# Patient Record
Sex: Female | Born: 1985 | Race: White | Hispanic: No | Marital: Married | Smoking: Former smoker
Health system: Southern US, Community
[De-identification: ages and names within clinical notes are randomized; demographics above are authoritative.]

## PROBLEM LIST (undated history)

## (undated) DIAGNOSIS — IMO0002 Reserved for concepts with insufficient information to code with codable children: Secondary | ICD-10-CM

## (undated) DIAGNOSIS — G43709 Chronic migraine without aura, not intractable, without status migrainosus: Secondary | ICD-10-CM

## (undated) DIAGNOSIS — R413 Other amnesia: Secondary | ICD-10-CM

## (undated) DIAGNOSIS — F32A Depression, unspecified: Secondary | ICD-10-CM

## (undated) DIAGNOSIS — G35D Multiple sclerosis, unspecified: Secondary | ICD-10-CM

## (undated) DIAGNOSIS — R200 Anesthesia of skin: Secondary | ICD-10-CM

## (undated) DIAGNOSIS — F329 Major depressive disorder, single episode, unspecified: Secondary | ICD-10-CM

## (undated) DIAGNOSIS — R42 Dizziness and giddiness: Secondary | ICD-10-CM

## (undated) DIAGNOSIS — H538 Other visual disturbances: Secondary | ICD-10-CM

## (undated) DIAGNOSIS — F319 Bipolar disorder, unspecified: Secondary | ICD-10-CM

## (undated) DIAGNOSIS — G35 Multiple sclerosis: Secondary | ICD-10-CM

## (undated) HISTORY — PX: CHOLECYSTECTOMY: SHX55

## (undated) HISTORY — PX: APPENDECTOMY: SHX54

## (undated) HISTORY — PX: TUBAL LIGATION: SHX77

## (undated) HISTORY — PX: KNEE SURGERY: SHX244

## (undated) HISTORY — PX: TONSILLECTOMY: SUR1361

---

## 2012-05-14 ENCOUNTER — Emergency Department (HOSPITAL_COMMUNITY)
Admission: EM | Admit: 2012-05-14 | Discharge: 2012-05-14 | Disposition: A | Discharge status: Home / Self Care | Payer: BC Managed Care – PPO | Attending: Emergency Medicine | Admitting: Emergency Medicine

## 2012-05-14 ENCOUNTER — Emergency Department (HOSPITAL_COMMUNITY): Payer: BC Managed Care – PPO

## 2012-05-14 ENCOUNTER — Encounter (HOSPITAL_COMMUNITY): Payer: Self-pay | Admitting: Emergency Medicine

## 2012-05-14 DIAGNOSIS — R209 Unspecified disturbances of skin sensation: Secondary | ICD-10-CM | POA: Insufficient documentation

## 2012-05-14 DIAGNOSIS — R200 Anesthesia of skin: Secondary | ICD-10-CM

## 2012-05-14 LAB — POCT I-STAT, CHEM 8
BUN: 14 mg/dL (ref 6–23)
Calcium, Ion: 1.18 mmol/L (ref 1.12–1.23)
Chloride: 109 mEq/L (ref 96–112)
Glucose, Bld: 88 mg/dL (ref 70–99)

## 2012-05-14 NOTE — ED Provider Notes (Signed)
History     CSN: [id number] Arrival date & time 05/14/12  1801 First MD Initiated Contact with Patient 05/14/12 2018      Chief Complaint  Patient presents with  . Numbness    HPI The patient presents to the emergency room with complaints of numbness to her left hand, arm, side and left leg. The symptoms started about a week ago. They have been persistent and possibly getting a little worse. Patient denies any headache. She denies any fever. She denies any trouble with her coordination or her balance. Patient went to another emergency department twice in the past week. She was diagnosed with a sinus infection and then was told she might have had a migraine headache. Patient has not had any imaging studies. She denies any history of prior headaches.  History reviewed. No pertinent past medical history.  Past Surgical History  Procedure Laterality Date  . Appendectomy    . Tonsillectomy    . Tubal ligation      Family History  Problem Relation Age of Onset  . Diabetes Other   . Hypertension Other     History  Substance Use Topics  . Smoking status: Never Smoker   . Smokeless tobacco: Not on file  . Alcohol Use: No    OB History   Grav Para Term Preterm Abortions TAB SAB Ect Mult Living                  Review of Systems  All other systems reviewed and are negative.    Allergies  Review of patient's allergies indicates no known allergies.  Home Medications   Current Outpatient Rx  Name  Route  Sig  Dispense  Refill  . ibuprofen (ADVIL,MOTRIN) 800 MG tablet   Oral   Take 800 mg by mouth every 8 (eight) hours as needed for pain.           BP 139/83  Pulse 84  Temp(Src) 98.6 F (37 C) (Oral)  Resp 18  SpO2 100%  LMP 05/12/2012  Physical Exam  Nursing note and vitals reviewed. Constitutional: She is oriented to person, place, and time. She appears well-developed and well-nourished. No distress.  HENT:  Head: Normocephalic and atraumatic.  Right Ear:  External ear normal.  Left Ear: External ear normal.  Mouth/Throat: Oropharynx is clear and moist.  Eyes: Conjunctivae are normal. Right eye exhibits no discharge. Left eye exhibits no discharge. No scleral icterus.  Neck: Neck supple. No tracheal deviation present.  Cardiovascular: Normal rate, regular rhythm and intact distal pulses.   Pulmonary/Chest: Effort normal and breath sounds normal. No stridor. No respiratory distress. She has no wheezes. She has no rales.  Abdominal: Soft. Bowel sounds are normal. She exhibits no distension. There is no tenderness. There is no rebound and no guarding.  Musculoskeletal: She exhibits no edema and no tenderness.  Neurological: She is alert and oriented to person, place, and time. She has normal strength. No cranial nerve deficit ( no gross defecits noted) or sensory deficit. She exhibits normal muscle tone. She displays no seizure activity. Coordination normal.  No pronator drift bilateral upper extrem, able to hold both legs off bed for 5 seconds, sensation intact in all extremities although subjectively it feels different on the left side, no visual field cuts, no left or right sided neglect  Skin: Skin is warm and dry. No rash noted.  Psychiatric: She has a normal mood and affect.    ED Course  Procedures (including critical  care time)  Labs Reviewed  POCT I-STAT, CHEM 8 - Abnormal; Notable for the following:    Potassium 3.4 (*)    All other components within normal limits   Ct Head Wo Contrast  05/14/2012  *RADIOLOGY REPORT*  Clinical Data: Numbness  CT HEAD WITHOUT CONTRAST  Technique:  Contiguous axial images were obtained from the base of the skull through the vertex without contrast.  Comparison: Head CT 12/23/2011.  Findings: No acute intracranial hemorrhage.  No focal mass lesion. No CT evidence of acute infarction.   No midline shift or mass effect.  No hydrocephalus.  Basilar cisterns are patent.  Paranasal sinuses and mastoid air cells  are clear.  Orbits are normal.  IMPRESSION: Normal head CT.   Original Report Authenticated By: Genevive Bi, M.D.      MDM  Pt has numbness but a normal neuro exam.  Discussed outpatient follow up with a neurologist for further evaluation, ie MS.  Pt stable for outpatient follow up.        Celene Kras, MD 05/14/12 2133

## 2012-05-14 NOTE — ED Notes (Signed)
Pt assessed and discharged by Dr. Lynelle Doctor.

## 2012-05-14 NOTE — ED Notes (Signed)
Pt states she has numbness to her left arm, hand, shoulder, and into the left side of her back  Pt states it started about a week ago  Pt states she has been seen twice for same and was told it was due to her migraine headache  Pt states she was seen at [facility] both times for this  Pt states the first time she could not see out of her left eye and then 3 days later the numbness started in her left hand and since it has progressed

## 2012-09-23 ENCOUNTER — Ambulatory Visit (INDEPENDENT_AMBULATORY_CARE_PROVIDER_SITE_OTHER): Payer: Self-pay | Admitting: Surgery

## 2013-02-09 ENCOUNTER — Other Ambulatory Visit (HOSPITAL_BASED_OUTPATIENT_CLINIC_OR_DEPARTMENT_OTHER): Payer: Self-pay | Admitting: Internal Medicine

## 2013-02-09 DIAGNOSIS — IMO0002 Reserved for concepts with insufficient information to code with codable children: Secondary | ICD-10-CM

## 2013-02-11 ENCOUNTER — Ambulatory Visit (HOSPITAL_BASED_OUTPATIENT_CLINIC_OR_DEPARTMENT_OTHER): Payer: BC Managed Care – PPO

## 2013-06-27 ENCOUNTER — Emergency Department (HOSPITAL_COMMUNITY)
Admission: EM | Admit: 2013-06-27 | Discharge: 2013-06-27 | Disposition: A | Discharge status: Home / Self Care | Payer: BC Managed Care – PPO | Attending: Emergency Medicine | Admitting: Emergency Medicine

## 2013-06-27 ENCOUNTER — Encounter (HOSPITAL_COMMUNITY): Payer: Self-pay | Admitting: Emergency Medicine

## 2013-06-27 ENCOUNTER — Emergency Department (HOSPITAL_COMMUNITY): Payer: BC Managed Care – PPO

## 2013-06-27 DIAGNOSIS — M545 Low back pain, unspecified: Secondary | ICD-10-CM | POA: Insufficient documentation

## 2013-06-27 DIAGNOSIS — R112 Nausea with vomiting, unspecified: Secondary | ICD-10-CM | POA: Insufficient documentation

## 2013-06-27 DIAGNOSIS — R509 Fever, unspecified: Secondary | ICD-10-CM | POA: Insufficient documentation

## 2013-06-27 DIAGNOSIS — Z3202 Encounter for pregnancy test, result negative: Secondary | ICD-10-CM | POA: Insufficient documentation

## 2013-06-27 DIAGNOSIS — Z9089 Acquired absence of other organs: Secondary | ICD-10-CM | POA: Insufficient documentation

## 2013-06-27 DIAGNOSIS — R109 Unspecified abdominal pain: Secondary | ICD-10-CM

## 2013-06-27 DIAGNOSIS — R1013 Epigastric pain: Secondary | ICD-10-CM | POA: Insufficient documentation

## 2013-06-27 DIAGNOSIS — Z79899 Other long term (current) drug therapy: Secondary | ICD-10-CM | POA: Insufficient documentation

## 2013-06-27 DIAGNOSIS — N898 Other specified noninflammatory disorders of vagina: Secondary | ICD-10-CM | POA: Insufficient documentation

## 2013-06-27 DIAGNOSIS — H53149 Visual discomfort, unspecified: Secondary | ICD-10-CM | POA: Insufficient documentation

## 2013-06-27 DIAGNOSIS — Z9851 Tubal ligation status: Secondary | ICD-10-CM | POA: Insufficient documentation

## 2013-06-27 DIAGNOSIS — R51 Headache: Secondary | ICD-10-CM | POA: Insufficient documentation

## 2013-06-27 DIAGNOSIS — R1084 Generalized abdominal pain: Secondary | ICD-10-CM | POA: Insufficient documentation

## 2013-06-27 DIAGNOSIS — Z791 Long term (current) use of non-steroidal anti-inflammatories (NSAID): Secondary | ICD-10-CM | POA: Insufficient documentation

## 2013-06-27 LAB — CBC WITH DIFFERENTIAL/PLATELET
BASOS PCT: 0 % (ref 0–1)
Basophils Absolute: 0 10*3/uL (ref 0.0–0.1)
EOS ABS: 0 10*3/uL (ref 0.0–0.7)
EOS PCT: 0 % (ref 0–5)
HCT: 38 % (ref 36.0–46.0)
HEMOGLOBIN: 13.1 g/dL (ref 12.0–15.0)
LYMPHS PCT: 16 % (ref 12–46)
Lymphs Abs: 1.4 10*3/uL (ref 0.7–4.0)
MCH: 28.7 pg (ref 26.0–34.0)
MCHC: 34.5 g/dL (ref 30.0–36.0)
MCV: 83.2 fL (ref 78.0–100.0)
MONO ABS: 0.5 10*3/uL (ref 0.1–1.0)
Monocytes Relative: 5 % (ref 3–12)
NEUTROS PCT: 79 % — AB (ref 43–77)
Neutro Abs: 7.1 10*3/uL (ref 1.7–7.7)
PLATELETS: ADEQUATE 10*3/uL (ref 150–400)
RBC: 4.57 MIL/uL (ref 3.87–5.11)
RDW: 13 % (ref 11.5–15.5)
WBC: 9 10*3/uL (ref 4.0–10.5)

## 2013-06-27 LAB — WET PREP, GENITAL
Clue Cells Wet Prep HPF POC: NONE SEEN
Trich, Wet Prep: NONE SEEN
Yeast Wet Prep HPF POC: NONE SEEN

## 2013-06-27 LAB — URINALYSIS, ROUTINE W REFLEX MICROSCOPIC
Bilirubin Urine: NEGATIVE
GLUCOSE, UA: NEGATIVE mg/dL
Hgb urine dipstick: NEGATIVE
Ketones, ur: NEGATIVE mg/dL
LEUKOCYTES UA: NEGATIVE
NITRITE: NEGATIVE
PROTEIN: NEGATIVE mg/dL
Specific Gravity, Urine: 1.025 (ref 1.005–1.030)
UROBILINOGEN UA: 1 mg/dL (ref 0.0–1.0)
pH: 6.5 (ref 5.0–8.0)

## 2013-06-27 LAB — COMPREHENSIVE METABOLIC PANEL
ALT: 17 U/L (ref 0–35)
AST: 24 U/L (ref 0–37)
Albumin: 3.8 g/dL (ref 3.5–5.2)
Alkaline Phosphatase: 61 U/L (ref 39–117)
BUN: 8 mg/dL (ref 6–23)
CALCIUM: 9 mg/dL (ref 8.4–10.5)
CHLORIDE: 100 meq/L (ref 96–112)
CO2: 21 mEq/L (ref 19–32)
CREATININE: 0.58 mg/dL (ref 0.50–1.10)
Glucose, Bld: 90 mg/dL (ref 70–99)
POTASSIUM: 3.8 meq/L (ref 3.7–5.3)
SODIUM: 137 meq/L (ref 137–147)
Total Bilirubin: 0.3 mg/dL (ref 0.3–1.2)
Total Protein: 6.9 g/dL (ref 6.0–8.3)

## 2013-06-27 LAB — LIPASE, BLOOD: LIPASE: 16 U/L (ref 11–59)

## 2013-06-27 LAB — PREGNANCY, URINE: PREG TEST UR: NEGATIVE

## 2013-06-27 MED ORDER — HYDROCODONE-ACETAMINOPHEN 5-325 MG PO TABS: 1.0000 | ORAL_TABLET | ORAL | Status: DC | PRN

## 2013-06-27 MED ORDER — IBUPROFEN 800 MG PO TABS: 800.0000 mg | ORAL_TABLET | Freq: Three times a day (TID) | ORAL | Status: DC | PRN

## 2013-06-27 MED ORDER — MORPHINE SULFATE 4 MG/ML IJ SOLN
4.0000 mg | Freq: Once | INTRAMUSCULAR | Status: AC
  Administered 2013-06-27: 4 mg via INTRAVENOUS
  Filled 2013-06-27: qty 1

## 2013-06-27 MED ORDER — SODIUM CHLORIDE 0.9 % IV BOLUS (SEPSIS)
1000.0000 mL | Freq: Once | INTRAVENOUS | Status: AC
  Administered 2013-06-27: 1000 mL via INTRAVENOUS

## 2013-06-27 MED ORDER — ONDANSETRON HCL 4 MG/2ML IJ SOLN
4.0000 mg | Freq: Once | INTRAMUSCULAR | Status: AC
  Administered 2013-06-27: 4 mg via INTRAVENOUS
  Filled 2013-06-27: qty 2

## 2013-06-27 MED ORDER — ONDANSETRON HCL 4 MG PO TABS: 4.0000 mg | ORAL_TABLET | Freq: Three times a day (TID) | ORAL | Status: DC | PRN

## 2013-06-27 NOTE — ED Provider Notes (Signed)
CSN: [id number]     Arrival date & time 06/27/13  1145 History   First MD Initiated Contact with Patient 06/27/13 1147     Chief Complaint  Patient presents with  . Abdominal Pain  . Emesis  . Headache     (Consider location/radiation/quality/duration/timing/severity/associated sxs/prior Treatment) The history is provided by the patient.    Patient reports sharp constant lower bowel pain, 8/10 intensity that began last night at 11 PM. Patient reports the pain is across her entire lower abdomen and lower back. She has had 3 episodes of nausea and vomiting. Emesis is watery, nonbloody. Last bowel movement was yesterday and was normal. Had a fever of 102 last night. Has taken ibuprofen without improvement. Denies urinary or vaginal symptoms. Last menstrual period was 22 days ago. Patient is sexually active does not use contraception because her tubes have been tied. Denies new sexual partners. Denies any new or different foods, denies travel or sick contacts.  Past surgical history includes cholecystectomy, appendectomy, tubal ligation. [id number]  History reviewed. No pertinent past medical history. Past Surgical History  Procedure Laterality Date  . Appendectomy    . Tonsillectomy    . Tubal ligation    . Cholecystectomy     Family History  Problem Relation Age of Onset  . Diabetes Other   . Hypertension Other    History  Substance Use Topics  . Smoking status: Never Smoker   . Smokeless tobacco: Not on file  . Alcohol Use: No   OB History   Grav Para Term Preterm Abortions TAB SAB Ect Mult Living                 Review of Systems  Constitutional: Positive for fever.  Eyes: Positive for photophobia (makes headache worse). Negative for visual disturbance.  Respiratory: Negative for cough and shortness of breath.   Cardiovascular: Negative for chest pain.  Gastrointestinal: Positive for nausea, vomiting and abdominal pain. Negative for diarrhea, constipation and blood in  stool.  Genitourinary: Negative for dysuria, urgency, frequency, vaginal bleeding, vaginal discharge and menstrual problem.  Musculoskeletal: Positive for back pain. Negative for neck stiffness.  Neurological: Positive for headaches. Negative for weakness and numbness.      Allergies  Review of patient's allergies indicates no known allergies.  Home Medications   Prior to Admission medications   Medication Sig Start Date End Date Taking? Authorizing Provider  ibuprofen (ADVIL,MOTRIN) 200 MG tablet Take 400 mg by mouth every 6 (six) hours as needed for mild pain.   Yes Historical Provider, MD  propranolol (INDERAL) 10 MG tablet Take 10 mg by mouth 2 (two) times daily.   Yes Historical Provider, MD   BP 114/74  Pulse 95  Temp(Src) 97.8 F (36.6 C) (Oral)  Resp 22  SpO2 100% Physical Exam  Nursing note and vitals reviewed. Constitutional: She appears well-developed and well-nourished. No distress.  HENT:  Head: Normocephalic and atraumatic.  Neck: Normal range of motion. Neck supple.  Cardiovascular: Normal rate and regular rhythm.   Pulmonary/Chest: Effort normal and breath sounds normal. No stridor. No respiratory distress. She has no wheezes. She has no rales.  Abdominal: Soft. She exhibits no distension and no mass. There is tenderness. There is no rebound and no guarding.  Obese.  Diffuse lower abdominal and epigastric tenderness  Genitourinary: Uterus is tender. Right adnexum displays tenderness. Left adnexum displays tenderness. There is tenderness around the vagina. No erythema or bleeding around the vagina. No foreign body around the vagina.  No signs of injury around the vagina. Vaginal discharge found.  Cervix tender.  Moderate amount of thin clear/grey fluid in the vagina.  Bimanual exam reproduces pain  Neurological: She is alert. No cranial nerve deficit or sensory deficit. She exhibits normal muscle tone. Gait normal. GCS eye subscore is 4. GCS verbal subscore is 5.  GCS motor subscore is 6.  Skin: She is not diaphoretic.    ED Course  Procedures (including critical care time) Labs Review Labs Reviewed  WET PREP, GENITAL - Abnormal; Notable for the following:    WBC, Wet Prep HPF POC RARE (*)    All other components within normal limits  CBC WITH DIFFERENTIAL - Abnormal; Notable for the following:    Neutrophils Relative % 79 (*)    All other components within normal limits  URINALYSIS, ROUTINE W REFLEX MICROSCOPIC - Abnormal; Notable for the following:    APPearance CLOUDY (*)    All other components within normal limits  GC/CHLAMYDIA PROBE AMP  COMPREHENSIVE METABOLIC PANEL  LIPASE, BLOOD  PREGNANCY, URINE    Imaging Review [place] Transvaginal Non-ob  06/27/2013   CLINICAL DATA:  Severe pelvic pain. Clinical suspicion for ovarian torsion.  EXAM: TRANSABDOMINAL AND TRANSVAGINAL ULTRASOUND OF PELVIS  DOPPLER ULTRASOUND OF OVARIES  TECHNIQUE: Both transabdominal and transvaginal ultrasound examinations of the pelvis were performed. Transabdominal technique was performed for global imaging of the pelvis including uterus, ovaries, adnexal regions, and pelvic cul-de-sac.  It was necessary to proceed with endovaginal exam following the transabdominal exam to visualize the ovaries and endometrial stripe. Color and duplex Doppler ultrasound was utilized to evaluate blood flow to the ovaries.  COMPARISON:  None.  FINDINGS: Uterus  Measurements: 7.5 x 4.6 x 5.9 cm. Retroflexed. No fibroids or other mass visualized.  Endometrium  Thickness: 10 mm.  No focal abnormality visualized.  Right ovary  Measurements: 4.0 x 2.5 x 2.9 cm. Normal appearance/no adnexal mass.  Left ovary  Measurements: 4.1 x 2.4 x 2.9 cm. Normal appearance/no adnexal mass.  Pulsed Doppler evaluation of both ovaries demonstrates normal low-resistance arterial and venous waveforms.  Other findings  No free fluid.  IMPRESSION: Normal appearance of retroflexed uterus and both ovaries. No pelvic mass or  other significant abnormality identified.  No sonographic evidence for ovarian torsion.   Electronically Signed   By: Myles RosenthalJohn  Stahl M.D.   On: 06/27/2013 15:34   [place] Pelvis Complete  06/27/2013   CLINICAL DATA:  Severe pelvic pain. Clinical suspicion for ovarian torsion.  EXAM: TRANSABDOMINAL AND TRANSVAGINAL ULTRASOUND OF PELVIS  DOPPLER ULTRASOUND OF OVARIES  TECHNIQUE: Both transabdominal and transvaginal ultrasound examinations of the pelvis were performed. Transabdominal technique was performed for global imaging of the pelvis including uterus, ovaries, adnexal regions, and pelvic cul-de-sac.  It was necessary to proceed with endovaginal exam following the transabdominal exam to visualize the ovaries and endometrial stripe. Color and duplex Doppler ultrasound was utilized to evaluate blood flow to the ovaries.  COMPARISON:  None.  FINDINGS: Uterus  Measurements: 7.5 x 4.6 x 5.9 cm. Retroflexed. No fibroids or other mass visualized.  Endometrium  Thickness: 10 mm.  No focal abnormality visualized.  Right ovary  Measurements: 4.0 x 2.5 x 2.9 cm. Normal appearance/no adnexal mass.  Left ovary  Measurements: 4.1 x 2.4 x 2.9 cm. Normal appearance/no adnexal mass.  Pulsed Doppler evaluation of both ovaries demonstrates normal low-resistance arterial and venous waveforms.  Other findings  No free fluid.  IMPRESSION: Normal appearance of retroflexed uterus and both ovaries.  No pelvic mass or other significant abnormality identified.  No sonographic evidence for ovarian torsion.   Electronically Signed   By: Myles Rosenthal M.D.   On: 06/27/2013 15:34   [place] Art/ven Flow Abd Pelv Doppler  06/27/2013   CLINICAL DATA:  Severe pelvic pain. Clinical suspicion for ovarian torsion.  EXAM: TRANSABDOMINAL AND TRANSVAGINAL ULTRASOUND OF PELVIS  DOPPLER ULTRASOUND OF OVARIES  TECHNIQUE: Both transabdominal and transvaginal ultrasound examinations of the pelvis were performed. Transabdominal technique was performed for global imaging  of the pelvis including uterus, ovaries, adnexal regions, and pelvic cul-de-sac.  It was necessary to proceed with endovaginal exam following the transabdominal exam to visualize the ovaries and endometrial stripe. Color and duplex Doppler ultrasound was utilized to evaluate blood flow to the ovaries.  COMPARISON:  None.  FINDINGS: Uterus  Measurements: 7.5 x 4.6 x 5.9 cm. Retroflexed. No fibroids or other mass visualized.  Endometrium  Thickness: 10 mm.  No focal abnormality visualized.  Right ovary  Measurements: 4.0 x 2.5 x 2.9 cm. Normal appearance/no adnexal mass.  Left ovary  Measurements: 4.1 x 2.4 x 2.9 cm. Normal appearance/no adnexal mass.  Pulsed Doppler evaluation of both ovaries demonstrates normal low-resistance arterial and venous waveforms.  Other findings  No free fluid.  IMPRESSION: Normal appearance of retroflexed uterus and both ovaries. No pelvic mass or other significant abnormality identified.  No sonographic evidence for ovarian torsion.   Electronically Signed   By: Myles Rosenthal M.D.   On: 06/27/2013 15:34     EKG Interpretation None      4:04 PM Reexamination of the abdomen is benign.  Mild TTP suprapubic area without guarding or rebound.   MDM   Final diagnoses:  Abdominal pain    Patient with lower abdominal pain and low back pain with associated nausea and vomiting that began last night.  CBC, CMP, lipase, UA, wet prep, pelvic US unremarkable. Not pregnant.  D/C home with pain and nausea medications, PCP resources for follow up.  Discussed result, findings, treatment, and follow up  with patient.  Pt given return precautions.  Pt verbalizes understanding and agrees with plan.        [place], PA-C 06/27/13 1607

## 2013-06-27 NOTE — ED Notes (Signed)
Pt states she began to have centralized abd pain with radiation to lower back that began last night at midnight. Pt states she has thrown up, but no diarrhea. Pt states she also has had HA for 2 days, no hx of migraines.

## 2013-06-27 NOTE — ED Notes (Signed)
She tells me she has had lower abd. Discomfort, plus has vomited about 4 times since yesterday evening.  She is in no distress.  It took some time to establish IV access, and she was very patient.

## 2013-06-27 NOTE — Discharge Instructions (Signed)
Read the information below.  Use the prescribed medication as directed.  Please discuss all new medications with your pharmacist.  Do not take additional tylenol while taking the prescribed pain medication to avoid overdose.  You may return to the Emergency Department at any time for worsening condition or any new symptoms that concern you.  If you develop high fevers, worsening abdominal pain, uncontrolled vomiting, or are unable to tolerate fluids by mouth, return to the ER for a recheck.   ° ° °Abdominal Pain, Adult °Many things can cause abdominal pain. Usually, abdominal pain is not caused by a disease and will improve without treatment. It can often be observed and treated at home. Your health care provider will do a physical exam and possibly order blood tests and X-rays to help determine the seriousness of your pain. However, in many cases, more time must pass before a clear cause of the pain can be found. Before that point, your health care provider may not know if you need more testing or further treatment. °HOME CARE INSTRUCTIONS  °Monitor your abdominal pain for any changes. The following actions may help to alleviate any discomfort you are experiencing: °· Only take over-the-counter or prescription medicines as directed by your health care provider. °· Do not take laxatives unless directed to do so by your health care provider. °· Try a clear liquid diet (broth, tea, or water) as directed by your health care provider. Slowly move to a bland diet as tolerated. °SEEK MEDICAL CARE IF: °· You have unexplained abdominal pain. °· You have abdominal pain associated with nausea or diarrhea. °· You have pain when you urinate or have a bowel movement. °· You experience abdominal pain that wakes you in the night. °· You have abdominal pain that is worsened or improved by eating food. °· You have abdominal pain that is worsened with eating fatty foods. °SEEK IMMEDIATE MEDICAL CARE IF:  °· Your pain does not go away  within 2 hours. °· You have a fever. °· You keep throwing up (vomiting). °· Your pain is felt only in portions of the abdomen, such as the right side or the left lower portion of the abdomen. °· You pass bloody or black tarry stools. °MAKE SURE YOU: °· Understand these instructions.   °· Will watch your condition.   °· Will get help right away if you are not doing well or get worse.   °Document Released: 12/05/2004 Document Revised: 12/16/2012 Document Reviewed: 11/04/2012 °ExitCare® Patient Information ©2014 [employer]. ° ° ° °Emergency Department Resource Guide °1) Find a Doctor and Pay Out of Pocket °Although you won't have to find out who is covered by your insurance plan, it is a good idea to ask around and get recommendations. You will then need to call the office and see if the doctor you have chosen will accept you as a new patient and what types of options they offer for patients who are self-pay. Some doctors offer discounts or will set up payment plans for their patients who do not have insurance, but you will need to ask so you aren't surprised when you get to your appointment. ° °2) Contact Your Local Health Department °Not all health departments have doctors that can see patients for sick visits, but many do, so it is worth a call to see if yours does. If you don't know where your local health department is, you can check in your phone book. The CDC also has a tool to help you locate your   state's health department, and many state websites also have listings of all of their local health departments. ° °3) Find a Walk-in Clinic °If your illness is not likely to be very severe or complicated, you may want to try a walk in clinic. These are popping up all over the country in pharmacies, drugstores, and shopping centers. They're usually staffed by nurse practitioners or physician assistants that have been trained to treat common illnesses and complaints. They're usually fairly quick and inexpensive.  However, if you have serious medical issues or chronic medical problems, these are probably not your best option. ° °No Primary Care Doctor: °- Call [facility] at  832-8000 - they can help you locate a primary care doctor that  accepts your insurance, provides certain services, etc. °- Physician Referral Service- 1-800-533-3463 ° °Chronic Pain Problems: °Organization         Address  Phone   Notes  °[facility]  (336) 297-2271 Patients need to be referred by their primary care doctor.  ° °Medication Assistance: °Organization         Address  Phone   Notes  °[place] [facility] [address] °[place], [place] [address] (336) 641-8030 --Must be a resident of [place] °-- Must have NO insurance coverage whatsoever (no Medicaid/ Medicare, etc.) °-- The pt. MUST have a primary care doctor that directs their care regularly and follows them in the community °  °[facility]  (866) 331-1348   °[employer]  (888) 892-1162   ° °Agencies that provide inexpensive medical care: °Organization         Address  Phone   Notes  °[facility]  (336) 832-8035   °[facility]    (336) 832-7272   °[facility] [address] °[place], [place] [address] (336) 832-4777   °[facility] [address], °[place] (336) 271-4999   °[facility]    (336) 373-0678   °[facility]    (336) 272-1050   °[facility] ° [address], [place] Phone:  (336) 832-4444, Fax:  (336) 832-4440 Hours of Operation:  9 am - 6 pm, M-F.  Also accepts Medicaid/Medicare and self-pay.  °[facility] ° [address], [place] Phone: (336) 832-3150, Fax: (336) 832-3151. Hours of Operation:  8:30 am - 5:30 pm, M-F.  Also accepts Medicaid and self-pay.  °[facility] [place] [address], [place] Phone: (336) 878-6027   °[facility] [address], [place], [place] (336)723-1848, Ext. 123 Mondays & Thursdays: 7-9 AM.  First 15 patients are seen on a first come, first serve basis. °  ° °Medicaid-accepting [place] Providers: ° °Organization         Address  Phone   Notes  °[facility] [address], [place] (336) 641-2100 Also accepts self-pay patients.  °[facility] [address], [place] ° (336) 856-9996   °[facility] [address], [place] (336) 288-8857   °[facility] [address] [place] [address], [place] (336) 299-7000   °Veita Bland [address], [place]  ° (336) 373-1557 Only accepts  Access Medicaid patients after they have their name applied to their card.  ° °Self-Pay (no insurance) in [place]: ° °Organization           Address  Phone   Notes  °Sickle Cell Patients, [facility] [address], [place] (336) 832-1970   °[facility] [address], [place] (336) 832-4400   °[facility] ° [address], [place] (336) 992-4800   °[facility]/Dr. Osei-Bonsu ° [address] [place] [address], [place] or [address], [place] (336) 841-8500 Phone number for both [place] and [place] locations is the same.  °[facility] [address], [place] (336) 299-0000   °[facility] [address] [place] [address], [place] or [address] (336) 852-7530 °(336) 878-2260   °[facility] [address], [place] (336) 350-1642, phone; (336) 294-5005, fax Sees patients 1st and 3rd Saturday of every month.  Must not qualify for public or private insurance (i.e. Medicaid, Medicare, [place] Health Choice, Veterans' Benefits) • Household income should be no more than 200% of the poverty level •The clinic cannot treat you if you are pregnant or think  you are pregnant • Sexually transmitted diseases are not treated at the clinic.  ° ° °Dental Care: °Organization         Address  Phone  Notes  °[facility] [address], [place] (336) 641-6152 Accepts children up to age 21 who are enrolled in Medicaid or [place] Health Choice; pregnant women with a Medicaid card; and children who have applied for Medicaid or [place] Health Choice, but were declined, whose parents can pay a reduced fee at time of service.  °[facility] [place]  [address], [place] (336) 641-7733 Accepts children up to age 21 who are enrolled in Medicaid or [place] Health Choice; pregnant women with a Medicaid card; and children who have applied for Medicaid or [place] Health Choice, but were declined, whose parents can pay a reduced fee at time of service.  °[facility] ° [address], [place] (336) 641-4533 Patients are seen by appointment only. Walk-ins are not accepted. [facility] will see patients 18 years of age and older. °Monday - Tuesday (8am-5pm) °Most Wednesdays (8:30-5pm) °$30 per visit, cash only  °[facility] ° [address], [place] (336) 641-4533 Patients are seen by appointment only. Walk-ins are not accepted. [facility] will see patients 18 years of age and older. °One Wednesday Evening (Monthly: Volunteer Based).  $30 per visit, cash only  °[facility]  (919) 537-3737 for adults; Children under age 4, call Graduate Pediatric Dentistry at (919) [phone]. Children aged 4-14, please call (919) [phone] to request a pediatric application. ° Dental services are provided in all areas of dental care including fillings, crowns and bridges, complete and partial dentures, implants, gum treatment, root canals, and extractions. Preventive care is also provided. Treatment is provided to both adults and  children. °Patients are selected via a lottery and there is often a waiting list. °  °[facility] [address], °[place] ° (336) 763-8833 [email or web address] °  °[facility] [address], [place], [place] (336)723-1848, Ext. 123 Second and Fourth Thursday of each month, opens at 6:30 AM; Clinic ends at 9 AM.  Patients are seen on a first-come first-served basis, and a limited number are seen during each clinic.  ° °[facility] ° [address], [place], [place] (336) 723-7904   Eligibility Requirements °You must have lived in [place], [place], or [place] counties for at least the last three months. °  You cannot be eligible for state or federal sponsored healthcare insurance, including Veterans Administration, Medicaid, or Medicare. °  You generally cannot be eligible for healthcare insurance through your employer.  °  How to apply: °Eligibility screenings are held every Tuesday and Wednesday afternoon from 1:00 pm until 4:00 pm. You do not need an appointment for the interview!  °[facility] [address], [place], [place] 336-631-2330   °[facility]  336-342-8273   °[facility]  336-703-3100   °[facility]  336-570-6415   ° °Behavioral Health Resources in the Community: °Intensive Outpatient Programs °Organization         Address  Phone  Notes  °[facility] [address], [place], [place] 336-878-6098   °[facility] [address], [place], [place] 336-832-9800   °[facility] [address], [place], [place] ° 336-882-2125   °[facility] [address],  °[place], [place] 1-800-853-5163 or 336-641-4981   °Substance Abuse Resources °Organization         Address  Phone  Notes  °[facility]  336-882-2125   °[facility]  336-784-9470   °[facility]  336-285-9073     °[facility]  336-845-3988   °Residential & Outpatient Substance Abuse Program  1-800-659-3381   °Psychological Services °Organization         Address  Phone  Notes  °[facility]  336- 832-9600   °[facility]  336- 378-7881   °[facility] [address], [place] 1-800-853-5163 or 336-641-4981   ° °Mobile Crisis Teams °Organization         Address  Phone  Notes  °[facility], Mobile Crisis Care Unit  1-877-626-1772   °Assertive °[facility] ° [address] [place], [place] 336-834-9664   °Sharon DeEsch [address] °[place] 336-554-5454   ° °Self-Help/Support Groups °Organization         Address  Phone             Notes  °[facility] - variety of support groups  336- 373-1402 Call for more information  °Narcotics Anonymous (NA), [facility] [address], °[place]  2 meetings at this location  ° °Residential Treatment Programs °Organization         Address  Phone  Notes  °[facility] [address],    °[place]  1-866-801-8205   °[facility] ° [address], [place], [place] 704-293-8524   °[facility] [address], [place] 336-845-3988 Admissions: 8am-3pm M-F  °[facility] [address]    °[place], [place] 336-841-1104   °[facility] [address], [place], [place] 336-379-7146   °[facility] [address]  °[place], [place] 336-285-9073   °[facility] Intensive Outpatient [address], [place], [place] 336-852-3033   °[facility] [address]  °[place], [place] 1-877-615-2722 or 336-784-9470   °[facility] [address] [place], [place] 336-227-7417 Accepts Medicaid  °[facility] [address]  °[place] 1-800-659-3381 Substance Abuse/Addiction Treatment  ° °[place]  Behavioral Health Resources °  Organization         Address  Phone  Notes  °[facility]  (888) 581-9988   °Julie Brannon, PhD [address] [place], [place]   (336) 349-5553 or (336) 951-0000   °[facility]   [address] °[place], [place] (336) 349-4454   °[facility] [address], [place], [place] (336) 342-8316 Insurance/Medicaid/sponsorship through [facility]  °[facility] [address]                                    [place], [place] (336) 342-8316 Therapy/tele-psych/case  °[facility] [address]  ° [place], [place] (336) 349-2233    °Dr. Arfeen  (336) 349-4544   °[facility] [place]  [employer] [place] [facility] 1) [address], [place] °2) [address], [place] °3)  [address], [place] (336) 349-3220 °(336) 342-7768 ° °(336) 342-8140   °[place] [facility] (336) 342-1394 or (336) 342-3537 (After Hours)    ° ° ° °

## 2013-06-27 NOTE — ED Notes (Signed)
Pelvic is set up 

## 2013-06-28 LAB — GC/CHLAMYDIA PROBE AMP
CT Probe RNA: NEGATIVE
GC PROBE AMP APTIMA: NEGATIVE

## 2013-06-29 NOTE — ED Provider Notes (Signed)
Medical screening examination/treatment/procedure(s) were performed by non-physician practitioner and as supervising physician I was immediately available for consultation/collaboration.   EKG Interpretation None       Doug Sou, MD 06/29/13 2041

## 2013-10-20 ENCOUNTER — Emergency Department (HOSPITAL_BASED_OUTPATIENT_CLINIC_OR_DEPARTMENT_OTHER)
Admission: EM | Admit: 2013-10-20 | Discharge: 2013-10-20 | Disposition: A | Discharge status: Home / Self Care | Payer: BC Managed Care – PPO | Attending: Emergency Medicine | Admitting: Emergency Medicine

## 2013-10-20 ENCOUNTER — Encounter (HOSPITAL_BASED_OUTPATIENT_CLINIC_OR_DEPARTMENT_OTHER): Payer: Self-pay | Admitting: Emergency Medicine

## 2013-10-20 DIAGNOSIS — Z79899 Other long term (current) drug therapy: Secondary | ICD-10-CM | POA: Insufficient documentation

## 2013-10-20 DIAGNOSIS — L678 Other hair color and hair shaft abnormalities: Secondary | ICD-10-CM | POA: Diagnosis not present

## 2013-10-20 DIAGNOSIS — L02419 Cutaneous abscess of limb, unspecified: Secondary | ICD-10-CM | POA: Diagnosis present

## 2013-10-20 DIAGNOSIS — L739 Follicular disorder, unspecified: Secondary | ICD-10-CM

## 2013-10-20 DIAGNOSIS — L03119 Cellulitis of unspecified part of limb: Secondary | ICD-10-CM | POA: Diagnosis present

## 2013-10-20 DIAGNOSIS — L738 Other specified follicular disorders: Secondary | ICD-10-CM | POA: Insufficient documentation

## 2013-10-20 MED ORDER — SULFAMETHOXAZOLE-TMP DS 800-160 MG PO TABS
1.0000 | ORAL_TABLET | Freq: Once | ORAL | Status: AC
  Administered 2013-10-20: 1 via ORAL
  Filled 2013-10-20: qty 1

## 2013-10-20 MED ORDER — NAPROXEN 500 MG PO TABS: 500.0000 mg | ORAL_TABLET | Freq: Two times a day (BID) | ORAL | Status: DC

## 2013-10-20 MED ORDER — SULFAMETHOXAZOLE-TRIMETHOPRIM 800-160 MG PO TABS: 1.0000 | ORAL_TABLET | Freq: Two times a day (BID) | ORAL | Status: DC

## 2013-10-20 MED ORDER — IBUPROFEN 400 MG PO TABS
600.0000 mg | ORAL_TABLET | Freq: Once | ORAL | Status: AC
  Administered 2013-10-20: 600 mg via ORAL
  Filled 2013-10-20 (×2): qty 1

## 2013-10-20 NOTE — ED Notes (Signed)
MD at bedside. 

## 2013-10-20 NOTE — ED Provider Notes (Signed)
CSN: [id number]     Arrival date & time 10/20/13  8144 History   First MD Initiated Contact with Patient 10/20/13 [phone]     Chief Complaint  Patient presents with  . Abscess      HPI Patient presents with complaints of a small abscess to her right medial thigh.  She reports a small amount of yellow discharge to this area.  She's been doing warm compresses without improvement in her symptoms.  Nothing worsens or improves her pain.  Pain is mild at this time.   History reviewed. No pertinent past medical history. Past Surgical History  Procedure Laterality Date  . Appendectomy    . Tonsillectomy    . Tubal ligation    . Cholecystectomy     Family History  Problem Relation Age of Onset  . Diabetes Other   . Hypertension Other    History  Substance Use Topics  . Smoking status: Never Smoker   . Smokeless tobacco: Not on file  . Alcohol Use: No   OB History   Grav Para Term Preterm Abortions TAB SAB Ect Mult Living                 Review of Systems  All other systems reviewed and are negative.     Allergies  Review of patient's allergies indicates no known allergies.  Home Medications   Prior to Admission medications   Medication Sig Start Date End Date Taking? Authorizing Provider  HYDROcodone-acetaminophen (NORCO/VICODIN) 5-325 MG per tablet Take 1 tablet by mouth every 4 (four) hours as needed for moderate pain or severe pain. 06/27/13   Trixie Dredge, PA-C  ibuprofen (ADVIL,MOTRIN) 200 MG tablet Take 400 mg by mouth every 6 (six) hours as needed for mild pain.    Historical Provider, MD  ibuprofen (ADVIL,MOTRIN) 800 MG tablet Take 1 tablet (800 mg total) by mouth every 8 (eight) hours as needed for mild pain or moderate pain. 06/27/13   Trixie Dredge, PA-C  naproxen (NAPROSYN) 500 MG tablet Take 1 tablet (500 mg total) by mouth 2 (two) times daily. 10/20/13   Lyanne Co, MD  ondansetron (ZOFRAN) 4 MG tablet Take 1 tablet (4 mg total) by mouth every 8 (eight) hours as  needed for nausea or vomiting. 06/27/13   Trixie Dredge, PA-C  propranolol (INDERAL) 10 MG tablet Take 10 mg by mouth 2 (two) times daily.    Historical Provider, MD  sulfamethoxazole-trimethoprim (SEPTRA DS) 800-160 MG per tablet Take 1 tablet by mouth every 12 (twelve) hours. 10/20/13   Lyanne Co, MD   BP 138/89  Pulse 81  Temp(Src) 98.2 F (36.8 C) (Oral)  Resp 16  Ht 5\' 3"  (1.6 m)  Wt 220 lb (99.791 kg)  BMI 38.98 kg/m2  SpO2 100%  LMP 10/20/2013 Physical Exam  Nursing note and vitals reviewed. Constitutional: She is oriented to person, place, and time. She appears well-developed and well-nourished.  HENT:  Head: Normocephalic.  Eyes: EOM are normal.  Neck: Normal range of motion.  Pulmonary/Chest: Effort normal.  Abdominal: She exhibits no distension.  Musculoskeletal: Normal range of motion.  Small amount of tenderness and punctate folliculitis of her right medial thigh.  No significant fluctuance.  No spreading erythema.   Neurological: She is alert and oriented to person, place, and time.  Psychiatric: She has a normal mood and affect.    ED Course  Procedures (including critical care time) Labs Review Labs Reviewed - No data to display  Imaging  Review No results found.   EKG Interpretation None      MDM   Final diagnoses:  Folliculitis     Patient will continue warm compresses and we'll place on a short course of antibiotics.  No indication for incision and drainage today.  She'll continue to monitor this area closely.  No spreading erythema or fluctuance.     Lyanne CoKevin M Giamarie Bueche, MD 10/20/13 [phone]

## 2013-10-20 NOTE — Discharge Instructions (Signed)

## 2013-10-20 NOTE — ED Notes (Signed)
Reports abscess to right thigh. Reports yellow discharge.

## 2013-10-26 ENCOUNTER — Emergency Department (HOSPITAL_BASED_OUTPATIENT_CLINIC_OR_DEPARTMENT_OTHER): Payer: BC Managed Care – PPO

## 2013-10-26 ENCOUNTER — Encounter (HOSPITAL_BASED_OUTPATIENT_CLINIC_OR_DEPARTMENT_OTHER): Payer: Self-pay | Admitting: Emergency Medicine

## 2013-10-26 ENCOUNTER — Emergency Department (HOSPITAL_BASED_OUTPATIENT_CLINIC_OR_DEPARTMENT_OTHER)
Admission: EM | Admit: 2013-10-26 | Discharge: 2013-10-26 | Disposition: A | Discharge status: Home / Self Care | Payer: BC Managed Care – PPO | Attending: Emergency Medicine | Admitting: Emergency Medicine

## 2013-10-26 DIAGNOSIS — R519 Headache, unspecified: Secondary | ICD-10-CM

## 2013-10-26 DIAGNOSIS — R300 Dysuria: Secondary | ICD-10-CM | POA: Insufficient documentation

## 2013-10-26 DIAGNOSIS — Z79899 Other long term (current) drug therapy: Secondary | ICD-10-CM | POA: Insufficient documentation

## 2013-10-26 DIAGNOSIS — R51 Headache: Secondary | ICD-10-CM | POA: Diagnosis present

## 2013-10-26 DIAGNOSIS — Z3202 Encounter for pregnancy test, result negative: Secondary | ICD-10-CM | POA: Insufficient documentation

## 2013-10-26 DIAGNOSIS — Z791 Long term (current) use of non-steroidal anti-inflammatories (NSAID): Secondary | ICD-10-CM | POA: Insufficient documentation

## 2013-10-26 DIAGNOSIS — Z792 Long term (current) use of antibiotics: Secondary | ICD-10-CM | POA: Diagnosis not present

## 2013-10-26 DIAGNOSIS — R209 Unspecified disturbances of skin sensation: Secondary | ICD-10-CM | POA: Diagnosis not present

## 2013-10-26 DIAGNOSIS — R112 Nausea with vomiting, unspecified: Secondary | ICD-10-CM | POA: Insufficient documentation

## 2013-10-26 LAB — URINALYSIS, ROUTINE W REFLEX MICROSCOPIC
GLUCOSE, UA: NEGATIVE mg/dL
Hgb urine dipstick: NEGATIVE
Ketones, ur: NEGATIVE mg/dL
LEUKOCYTES UA: NEGATIVE
NITRITE: NEGATIVE
PH: 5.5 (ref 5.0–8.0)
Protein, ur: NEGATIVE mg/dL
SPECIFIC GRAVITY, URINE: 1.035 — AB (ref 1.005–1.030)
Urobilinogen, UA: 0.2 mg/dL (ref 0.0–1.0)

## 2013-10-26 LAB — PREGNANCY, URINE: PREG TEST UR: NEGATIVE

## 2013-10-26 MED ORDER — DIPHENHYDRAMINE HCL 50 MG/ML IJ SOLN
25.0000 mg | Freq: Once | INTRAMUSCULAR | Status: AC
  Administered 2013-10-26: 25 mg via INTRAVENOUS
  Filled 2013-10-26: qty 1

## 2013-10-26 MED ORDER — DEXAMETHASONE SODIUM PHOSPHATE 10 MG/ML IJ SOLN: 10.0000 mg | Freq: Once | INTRAMUSCULAR | Status: AC

## 2013-10-26 MED ORDER — SODIUM CHLORIDE 0.9 % IV BOLUS (SEPSIS)
1000.0000 mL | INTRAVENOUS | Status: AC
  Administered 2013-10-26: 1000 mL via INTRAVENOUS

## 2013-10-26 MED ORDER — METOCLOPRAMIDE HCL 5 MG/ML IJ SOLN
5.0000 mg | Freq: Once | INTRAMUSCULAR | Status: AC
  Administered 2013-10-26: 5 mg via INTRAVENOUS
  Filled 2013-10-26: qty 2

## 2013-10-26 MED ORDER — DEXAMETHASONE SODIUM PHOSPHATE 4 MG/ML IJ SOLN
INTRAMUSCULAR | Status: AC
  Administered 2013-10-26: 10 mg via INTRAVENOUS
  Filled 2013-10-26: qty 3

## 2013-10-26 NOTE — ED Notes (Signed)
Pt reports that she has had headache, difficulty urinating, nausea, pain in right leg x 2 days.

## 2013-10-26 NOTE — ED Provider Notes (Signed)
CSN: [id number]     Arrival date & time 10/26/13  1414 History   First MD Initiated Contact with Patient 10/26/13 1519     Chief Complaint  Patient presents with  . Headache     (Consider location/radiation/quality/duration/timing/severity/associated sxs/prior Treatment) Patient is a 28 y.o. female presenting with headaches. The history is provided by the patient.  Headache Pain location:  Generalized Quality: throbbing. Radiates to:  Does not radiate Severity currently:  8/10 Severity at highest:  8/10 Onset quality:  Sudden Duration:  2 hours Timing:  Constant Progression:  Unchanged Chronicity:  New Similar to prior headaches: no   Context: coughing   Relieved by:  Nothing Worsened by:  Nothing tried Ineffective treatments: excedrin, ibuprofen. Associated symptoms: nausea and vomiting   Associated symptoms: no abdominal pain, no back pain, no congestion, no cough, no diarrhea, no dizziness, no pain, no fatigue, no fever and no neck pain     History reviewed. No pertinent past medical history. Past Surgical History  Procedure Laterality Date  . Appendectomy    . Tonsillectomy    . Tubal ligation    . Cholecystectomy     Family History  Problem Relation Age of Onset  . Diabetes Other   . Hypertension Other    History  Substance Use Topics  . Smoking status: Never Smoker   . Smokeless tobacco: Not on file  . Alcohol Use: No   OB History   Grav Para Term Preterm Abortions TAB SAB Ect Mult Living                 Review of Systems  Constitutional: Negative for fever and fatigue.  HENT: Negative for congestion and drooling.   Eyes: Negative for pain.  Respiratory: Negative for cough and shortness of breath.   Cardiovascular: Negative for chest pain.  Gastrointestinal: Positive for nausea and vomiting. Negative for abdominal pain and diarrhea.  Genitourinary: Positive for dysuria. Negative for hematuria.  Musculoskeletal: Negative for back pain, gait problem  and neck pain.  Skin: Negative for color change.  Neurological: Positive for headaches. Negative for dizziness.       Paresthesias of right lateral thigh and right flank   Hematological: Negative for adenopathy.  Psychiatric/Behavioral: Negative for behavioral problems.  All other systems reviewed and are negative.     Allergies  Review of patient's allergies indicates no known allergies.  Home Medications   Prior to Admission medications   Medication Sig Start Date End Date Taking? Authorizing Provider  ibuprofen (ADVIL,MOTRIN) 800 MG tablet Take 1 tablet (800 mg total) by mouth every 8 (eight) hours as needed for mild pain or moderate pain. 06/27/13  Yes Trixie Dredge, PA-C  sulfamethoxazole-trimethoprim (SEPTRA DS) 800-160 MG per tablet Take 1 tablet by mouth every 12 (twelve) hours. 10/20/13  Yes Lyanne Co, MD  HYDROcodone-acetaminophen (NORCO/VICODIN) 5-325 MG per tablet Take 1 tablet by mouth every 4 (four) hours as needed for moderate pain or severe pain. 06/27/13   Trixie Dredge, PA-C  ibuprofen (ADVIL,MOTRIN) 200 MG tablet Take 400 mg by mouth every 6 (six) hours as needed for mild pain.    Historical Provider, MD  naproxen (NAPROSYN) 500 MG tablet Take 1 tablet (500 mg total) by mouth 2 (two) times daily. 10/20/13   Lyanne Co, MD  ondansetron (ZOFRAN) 4 MG tablet Take 1 tablet (4 mg total) by mouth every 8 (eight) hours as needed for nausea or vomiting. 06/27/13   Trixie Dredge, PA-C  propranolol (INDERAL) 10  MG tablet Take 10 mg by mouth 2 (two) times daily.    Historical Provider, MD   BP 133/86  Pulse 85  Temp(Src) 98.4 F (36.9 C) (Oral)  Resp 16  Ht 5\' 3"  (1.6 m)  Wt 220 lb (99.791 kg)  BMI 38.98 kg/m2  SpO2 100%  LMP 10/20/2013 Physical Exam  Nursing note and vitals reviewed. Constitutional: She is oriented to person, place, and time. She appears well-developed and well-nourished.  HENT:  Head: Normocephalic and atraumatic.  Mouth/Throat: Oropharynx is clear and  moist. No oropharyngeal exudate.  Eyes: Conjunctivae and EOM are normal. Pupils are equal, round, and reactive to light.  Neck: Normal range of motion. Neck supple.  Cardiovascular: Normal rate, regular rhythm, normal heart sounds and intact distal pulses.  Exam reveals no gallop and no friction rub.   No murmur heard. Pulmonary/Chest: Effort normal and breath sounds normal. No respiratory distress. She has no wheezes.  Abdominal: Soft. Bowel sounds are normal. There is no tenderness. There is no rebound and no guarding.  Musculoskeletal: Normal range of motion. She exhibits no edema and no tenderness.  Neurological: She is alert and oriented to person, place, and time.  alert, oriented x3 speech: normal in context and clarity memory: intact grossly cranial nerves II-XII: intact motor strength: full proximally and distally no involuntary movements or tremors sensation: intact to light touch diffusely  cerebellar: finger-to-nose and heel-to-shin intact gait: normal forwards and backwards, normal tandem gait   Skin: Skin is warm and dry.  Psychiatric: She has a normal mood and affect. Her behavior is normal.    ED Course  Procedures (including critical care time) Labs Review Labs Reviewed  URINALYSIS, ROUTINE W REFLEX MICROSCOPIC - Abnormal; Notable for the following:    Specific Gravity, Urine 1.035 (*)    Bilirubin Urine SMALL (*)    All other components within normal limits  PREGNANCY, URINE    Imaging Review Ct Head Wo Contrast  10/26/2013   CLINICAL DATA:  Sudden onset of headache 3 hr ago with nausea and vomiting.  EXAM: CT HEAD WITHOUT CONTRAST  TECHNIQUE: Contiguous axial images were obtained from the base of the skull through the vertex without intravenous contrast.  COMPARISON:  05/14/2012.  FINDINGS: Sinuses/Soft tissues: Mild mucosal thickening of ethmoid air cells. Other paranasal sinuses clear.  Intracranial: No mass lesion, hemorrhage, hydrocephalus, acute infarct,  intra-axial, or extra-axial fluid collection.  IMPRESSION: 1.  No acute intracranial abnormality. 2. Minimal sinus disease.   Electronically Signed   By: Jeronimo Greaves M.D.   On: 10/26/2013 16:06     EKG Interpretation None      MDM   Final diagnoses:  Headache, unspecified headache type    3:41 PM 28 y.o. 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 The patient states that the headache was brisk in onset. Currently an 8/10. She is afebrile and vital signs are unremarkable here. She has a normal neurologic exam. Will treat with headache cocktail and get a screening CT.  5:09 PM: HA improved, now 4/10. CT neg. Given CT <6hrs after onset, unlikely SAH. Pt cont to appear well on exam as well.  I have discussed the diagnosis/risks/treatment options with the patient and believe the pt to be eligible for discharge home to follow-up with pcp as needed. We also discussed returning to the ED immediately if new or worsening sx occur. We discussed the sx which are most concerning (e.g., worsening HA, fever) that necessitate immediate return.  Medications administered to the patient during their visit and any new prescriptions provided to the patient are listed below.  Medications given during this visit Medications  sodium chloride 0.9 % bolus 1,000 mL (1,000 mLs Intravenous New Bag/Given 10/26/13 1639)  dexamethasone (DECADRON) injection 10 mg (not administered)  metoCLOPramide (REGLAN) injection 5 mg (5 mg Intravenous Given 10/26/13 1640)  diphenhydrAMINE (BENADRYL) injection 25 mg (25 mg Intravenous Given 10/26/13 1640)    New Prescriptions   No medications on file     Purvis SheffieldForrest Ishaq Maffei, MD 10/26/13 1712

## 2013-11-01 ENCOUNTER — Emergency Department (HOSPITAL_BASED_OUTPATIENT_CLINIC_OR_DEPARTMENT_OTHER)
Admission: EM | Admit: 2013-11-01 | Discharge: 2013-11-01 | Disposition: A | Discharge status: Home / Self Care | Payer: BC Managed Care – PPO | Attending: Emergency Medicine | Admitting: Emergency Medicine

## 2013-11-01 ENCOUNTER — Encounter (HOSPITAL_BASED_OUTPATIENT_CLINIC_OR_DEPARTMENT_OTHER): Payer: Self-pay | Admitting: Emergency Medicine

## 2013-11-01 DIAGNOSIS — R079 Chest pain, unspecified: Secondary | ICD-10-CM | POA: Diagnosis not present

## 2013-11-01 DIAGNOSIS — K2970 Gastritis, unspecified, without bleeding: Secondary | ICD-10-CM | POA: Diagnosis not present

## 2013-11-01 DIAGNOSIS — Z79899 Other long term (current) drug therapy: Secondary | ICD-10-CM | POA: Insufficient documentation

## 2013-11-01 DIAGNOSIS — Z791 Long term (current) use of non-steroidal anti-inflammatories (NSAID): Secondary | ICD-10-CM | POA: Insufficient documentation

## 2013-11-01 DIAGNOSIS — Z3202 Encounter for pregnancy test, result negative: Secondary | ICD-10-CM | POA: Diagnosis not present

## 2013-11-01 DIAGNOSIS — Z792 Long term (current) use of antibiotics: Secondary | ICD-10-CM | POA: Insufficient documentation

## 2013-11-01 DIAGNOSIS — R42 Dizziness and giddiness: Secondary | ICD-10-CM | POA: Diagnosis not present

## 2013-11-01 DIAGNOSIS — R112 Nausea with vomiting, unspecified: Secondary | ICD-10-CM | POA: Diagnosis present

## 2013-11-01 DIAGNOSIS — K2990 Gastroduodenitis, unspecified, without bleeding: Secondary | ICD-10-CM

## 2013-11-01 DIAGNOSIS — R197 Diarrhea, unspecified: Secondary | ICD-10-CM | POA: Insufficient documentation

## 2013-11-01 DIAGNOSIS — R509 Fever, unspecified: Secondary | ICD-10-CM | POA: Diagnosis not present

## 2013-11-01 LAB — COMPREHENSIVE METABOLIC PANEL
ALT: 17 U/L (ref 0–35)
AST: 19 U/L (ref 0–37)
Albumin: 3.9 g/dL (ref 3.5–5.2)
Alkaline Phosphatase: 48 U/L (ref 39–117)
Anion gap: 12 (ref 5–15)
BILIRUBIN TOTAL: 0.4 mg/dL (ref 0.3–1.2)
BUN: 16 mg/dL (ref 6–23)
CHLORIDE: 102 meq/L (ref 96–112)
CO2: 27 mEq/L (ref 19–32)
CREATININE: 1 mg/dL (ref 0.50–1.10)
Calcium: 9.7 mg/dL (ref 8.4–10.5)
GFR, EST AFRICAN AMERICAN: 89 mL/min — AB (ref >90)
GFR, EST NON AFRICAN AMERICAN: 77 mL/min — AB (ref >90)
GLUCOSE: 94 mg/dL (ref 70–99)
Potassium: 3.5 mEq/L — ABNORMAL LOW (ref 3.7–5.3)
Sodium: 141 mEq/L (ref 137–147)
Total Protein: 7.2 g/dL (ref 6.0–8.3)

## 2013-11-01 LAB — URINALYSIS, ROUTINE W REFLEX MICROSCOPIC
BILIRUBIN URINE: NEGATIVE
Glucose, UA: NEGATIVE mg/dL
Hgb urine dipstick: NEGATIVE
KETONES UR: NEGATIVE mg/dL
Leukocytes, UA: NEGATIVE
Nitrite: NEGATIVE
PH: 5.5 (ref 5.0–8.0)
Protein, ur: NEGATIVE mg/dL
Specific Gravity, Urine: 1.019 (ref 1.005–1.030)
Urobilinogen, UA: 0.2 mg/dL (ref 0.0–1.0)

## 2013-11-01 LAB — PREGNANCY, URINE: Preg Test, Ur: NEGATIVE

## 2013-11-01 LAB — CBC WITH DIFFERENTIAL/PLATELET
BASOS ABS: 0 10*3/uL (ref 0.0–0.1)
Basophils Relative: 0 % (ref 0–1)
Eosinophils Absolute: 0.1 10*3/uL (ref 0.0–0.7)
Eosinophils Relative: 1 % (ref 0–5)
HCT: 35.8 % — ABNORMAL LOW (ref 36.0–46.0)
HEMOGLOBIN: 12.2 g/dL (ref 12.0–15.0)
LYMPHS PCT: 27 % (ref 12–46)
Lymphs Abs: 2.7 10*3/uL (ref 0.7–4.0)
MCH: 29.3 pg (ref 26.0–34.0)
MCHC: 34.1 g/dL (ref 30.0–36.0)
MCV: 86.1 fL (ref 78.0–100.0)
Monocytes Absolute: 0.8 10*3/uL (ref 0.1–1.0)
Monocytes Relative: 8 % (ref 3–12)
NEUTROS ABS: 6.5 10*3/uL (ref 1.7–7.7)
Neutrophils Relative %: 65 % (ref 43–77)
Platelets: 230 10*3/uL (ref 150–400)
RBC: 4.16 MIL/uL (ref 3.87–5.11)
RDW: 12.6 % (ref 11.5–15.5)
WBC: 10.1 10*3/uL (ref 4.0–10.5)

## 2013-11-01 LAB — LIPASE, BLOOD: Lipase: 18 U/L (ref 11–59)

## 2013-11-01 MED ORDER — KETOROLAC TROMETHAMINE 30 MG/ML IJ SOLN
30.0000 mg | Freq: Once | INTRAMUSCULAR | Status: AC
  Administered 2013-11-01: 30 mg via INTRAVENOUS
  Filled 2013-11-01: qty 1

## 2013-11-01 MED ORDER — ONDANSETRON 8 MG PO TBDP: ORAL_TABLET | ORAL | Status: DC

## 2013-11-01 MED ORDER — PROMETHAZINE HCL 12.5 MG RE SUPP: 12.5000 mg | Freq: Four times a day (QID) | RECTAL | Status: DC | PRN

## 2013-11-01 MED ORDER — ONDANSETRON HCL 4 MG/2ML IJ SOLN
4.0000 mg | Freq: Once | INTRAMUSCULAR | Status: AC
  Administered 2013-11-01: 4 mg via INTRAVENOUS
  Filled 2013-11-01: qty 2

## 2013-11-01 MED ORDER — GI COCKTAIL ~~LOC~~
30.0000 mL | Freq: Once | ORAL | Status: AC
  Administered 2013-11-01: 30 mL via ORAL
  Filled 2013-11-01: qty 30

## 2013-11-01 MED ORDER — SUCRALFATE 1 GM/10ML PO SUSP: 1.0000 g | Freq: Three times a day (TID) | ORAL | Status: DC

## 2013-11-01 MED ORDER — SODIUM CHLORIDE 0.9 % IV BOLUS (SEPSIS)
1000.0000 mL | Freq: Once | INTRAVENOUS | Status: AC
  Administered 2013-11-01: 1000 mL via INTRAVENOUS

## 2013-11-01 NOTE — Discharge Instructions (Signed)

## 2013-11-01 NOTE — ED Notes (Signed)
Patient states that she was sick and having N/V/D for about 1 week, she woke up at about 1 am with dizziness and the chest pain.

## 2013-11-01 NOTE — ED Provider Notes (Signed)
CSN: [id number]     Arrival date & time 11/01/13  0355 History   First MD Initiated Contact with Patient 11/01/13 0408     Chief Complaint  Patient presents with  . Chest Pain  . Dizziness  . Nausea  . Morning Sickness     (Consider location/radiation/quality/duration/timing/severity/associated sxs/prior Treatment) Patient is a 28 y.o. female presenting with chest pain and dizziness. The history is provided by the patient.  Chest Pain Pain location:  L chest Pain quality: aching   Pain radiates to:  Does not radiate Pain radiates to the back: no   Pain severity:  Moderate Onset quality:  Sudden Timing:  Constant Progression:  Unchanged Chronicity:  New Context: movement   Relieved by:  Nothing Worsened by:  Nothing tried Associated symptoms: dizziness, fever, nausea and vomiting   Associated symptoms: no anorexia, no back pain, no claudication, no cough, no lower extremity edema, no numbness, no orthopnea, no palpitations, no shortness of breath and no syncope   Associated symptoms comment:  And diarrhea x 1 week Vomiting:    Quality:  Stomach contents   Severity:  Moderate   Timing:  Sporadic   Progression:  Unchanged Risk factors: surgery   Risk factors: no birth control, no immobilization, not pregnant, no prior DVT/PE and no smoking   Dizziness Associated symptoms: chest pain, diarrhea, nausea and vomiting   Associated symptoms: no palpitations, no shortness of breath and no syncope     Past Medical History  Diagnosis Date  . Headache    Past Surgical History  Procedure Laterality Date  . Appendectomy    . Tonsillectomy    . Tubal ligation    . Cholecystectomy     Family History  Problem Relation Age of Onset  . Diabetes Other   . Hypertension Other    History  Substance Use Topics  . Smoking status: Never Smoker   . Smokeless tobacco: Not on file  . Alcohol Use: No   OB History   Grav Para Term Preterm Abortions TAB SAB Ect Mult Living        Review of Systems  Constitutional: Positive for fever.  Respiratory: Negative for cough and shortness of breath.   Cardiovascular: Positive for chest pain. Negative for palpitations, orthopnea, claudication and syncope.  Gastrointestinal: Positive for nausea, vomiting and diarrhea. Negative for anorexia.  Musculoskeletal: Negative for back pain.  Neurological: Positive for dizziness. Negative for numbness.  All other systems reviewed and are negative.     Allergies  Review of patient's allergies indicates no known allergies.  Home Medications   Prior to Admission medications   Medication Sig Start Date End Date Taking? Authorizing Provider  HYDROcodone-acetaminophen (NORCO/VICODIN) 5-325 MG per tablet Take 1 tablet by mouth every 4 (four) hours as needed for moderate pain or severe pain. 06/27/13   Trixie Dredge, PA-C  ibuprofen (ADVIL,MOTRIN) 200 MG tablet Take 400 mg by mouth every 6 (six) hours as needed for mild pain.    Historical Provider, MD  ibuprofen (ADVIL,MOTRIN) 800 MG tablet Take 1 tablet (800 mg total) by mouth every 8 (eight) hours as needed for mild pain or moderate pain. 06/27/13   Trixie Dredge, PA-C  naproxen (NAPROSYN) 500 MG tablet Take 1 tablet (500 mg total) by mouth 2 (two) times daily. 10/20/13   Lyanne Co, MD  ondansetron (ZOFRAN) 4 MG tablet Take 1 tablet (4 mg total) by mouth every 8 (eight) hours as needed for nausea or vomiting. 06/27/13   Irving Burton  West, PA-C  propranolol (INDERAL) 10 MG tablet Take 10 mg by mouth 2 (two) times daily.    Historical Provider, MD  sulfamethoxazole-trimethoprim (SEPTRA DS) 800-160 MG per tablet Take 1 tablet by mouth every 12 (twelve) hours. 10/20/13   Lyanne Co, MD   BP 134/67  Pulse 87  Temp(Src) 98.8 F (37.1 C) (Oral)  Resp 18  Ht  (1.6 m)  Wt 220 lb (99.791 kg)  BMI 38.98 kg/m2  SpO2 100%  LMP 10/20/2013 Physical Exam  Constitutional: She is oriented to person, place, and time. She appears well-developed  and well-nourished. No distress.  Well appearing sleeping upon entrance to the room  HENT:  Head: Normocephalic and atraumatic.  Mouth/Throat: Oropharynx is clear and moist. No oropharyngeal exudate.  Eyes: Conjunctivae and EOM are normal. Pupils are equal, round, and reactive to light.  Neck: Normal range of motion. Neck supple.  Cardiovascular: Normal rate, regular rhythm and intact distal pulses.   Pulmonary/Chest: Effort normal and breath sounds normal. No respiratory distress. She has no wheezes. She has no rales. She exhibits tenderness.  Abdominal: Soft. She exhibits no mass. Bowel sounds are increased. There is no tenderness. There is no rebound, no guarding, no tenderness at McBurney's point and negative Murphy's sign.  Musculoskeletal: Normal range of motion. She exhibits no edema and no tenderness.  Neurological: She is alert and oriented to person, place, and time. She has normal reflexes.  Skin: Skin is warm and dry.  Psychiatric: She has a normal mood and affect.    ED Course  Procedures (including critical care time) Labs Review Labs Reviewed  CBC WITH DIFFERENTIAL  COMPREHENSIVE METABOLIC PANEL  LIPASE, BLOOD  PREGNANCY, URINE  URINALYSIS, ROUTINE W REFLEX MICROSCOPIC    Imaging Review No results found.   EKG Interpretation   Date/Time:  Monday November 01 2013 04:12:49 EDT Ventricular Rate:  91 PR Interval:  146 QRS Duration: 102 QT Interval:  366 QTC Calculation: 450 R Axis:   -12 Text Interpretation:  Normal sinus rhythm Confirmed by [facility]  MD,  Morene Antu ([id number]) on 11/01/2013 4:33:39 AM      MDM   Final diagnoses:  None    Patient states she had the n/v/d when being seen for the headache on 8/18.  With patient's nurse Tresa Endo present for history and physical patient denies being on any and all medication however based on care everywhere patient was seen on the 19th by her PMD no nausea and anorexia without diarrhea and vaginal discharge for which  she was given and RX for 10 days of doxycycline and flagyl.  Per care everwhere patient called in to PMD office on 8/21 stating she had hives relieved by benadryl and was told to stop both medications and if no further symptoms restart flagyl only and follow up the next day.    Urine specific gravity is normal without ketones.  No diarrhea here will treat for gastritis and nausea.  Follow up with your PMD for recheck of your PMD as directed in the notes.    Jasmine Awe, MD 11/01/13 [phone]

## 2014-05-24 ENCOUNTER — Encounter (HOSPITAL_BASED_OUTPATIENT_CLINIC_OR_DEPARTMENT_OTHER): Payer: Self-pay | Admitting: *Deleted

## 2014-05-24 ENCOUNTER — Emergency Department (HOSPITAL_BASED_OUTPATIENT_CLINIC_OR_DEPARTMENT_OTHER)
Admission: EM | Admit: 2014-05-24 | Discharge: 2014-05-25 | Disposition: A | Discharge status: Home / Self Care | Payer: BLUE CROSS/BLUE SHIELD | Attending: Emergency Medicine | Admitting: Emergency Medicine

## 2014-05-24 ENCOUNTER — Emergency Department (HOSPITAL_BASED_OUTPATIENT_CLINIC_OR_DEPARTMENT_OTHER): Payer: BLUE CROSS/BLUE SHIELD

## 2014-05-24 DIAGNOSIS — R51 Headache: Secondary | ICD-10-CM | POA: Diagnosis present

## 2014-05-24 DIAGNOSIS — Z79899 Other long term (current) drug therapy: Secondary | ICD-10-CM | POA: Diagnosis not present

## 2014-05-24 DIAGNOSIS — G43109 Migraine with aura, not intractable, without status migrainosus: Secondary | ICD-10-CM | POA: Diagnosis not present

## 2014-05-24 DIAGNOSIS — Z791 Long term (current) use of non-steroidal anti-inflammatories (NSAID): Secondary | ICD-10-CM | POA: Insufficient documentation

## 2014-05-24 DIAGNOSIS — R519 Headache, unspecified: Secondary | ICD-10-CM

## 2014-05-24 DIAGNOSIS — M542 Cervicalgia: Secondary | ICD-10-CM | POA: Insufficient documentation

## 2014-05-24 MED ORDER — KETOROLAC TROMETHAMINE 30 MG/ML IJ SOLN
30.0000 mg | Freq: Once | INTRAMUSCULAR | Status: AC
  Administered 2014-05-24: 30 mg via INTRAVENOUS
  Filled 2014-05-24: qty 1

## 2014-05-24 MED ORDER — METOCLOPRAMIDE HCL 5 MG/ML IJ SOLN
10.0000 mg | Freq: Once | INTRAMUSCULAR | Status: AC
  Administered 2014-05-24: 10 mg via INTRAVENOUS
  Filled 2014-05-24: qty 2

## 2014-05-24 MED ORDER — MAGNESIUM SULFATE 50 % IJ SOLN
INTRAMUSCULAR | Status: AC
  Administered 2014-05-24: 1 g
  Filled 2014-05-24: qty 2

## 2014-05-24 MED ORDER — MAGNESIUM SULFATE IN D5W 10-5 MG/ML-% IV SOLN
1.0000 g | Freq: Once | INTRAVENOUS | Status: AC
  Administered 2014-05-24: 1 g via INTRAVENOUS
  Filled 2014-05-24: qty 100

## 2014-05-24 MED ORDER — DEXAMETHASONE SODIUM PHOSPHATE 10 MG/ML IJ SOLN
10.0000 mg | Freq: Once | INTRAMUSCULAR | Status: AC
  Administered 2014-05-24: 10 mg via INTRAVENOUS
  Filled 2014-05-24: qty 1

## 2014-05-24 MED ORDER — NITROGLYCERIN 2 % TD OINT: 1.0000 [in_us] | TOPICAL_OINTMENT | Freq: Once | TRANSDERMAL | Status: DC

## 2014-05-24 MED ORDER — DIPHENHYDRAMINE HCL 50 MG/ML IJ SOLN
25.0000 mg | Freq: Once | INTRAMUSCULAR | Status: AC
  Administered 2014-05-24: 25 mg via INTRAVENOUS
  Filled 2014-05-24: qty 1

## 2014-05-24 MED ORDER — SODIUM CHLORIDE 0.9 % IV BOLUS (SEPSIS)
1000.0000 mL | Freq: Once | INTRAVENOUS | Status: AC
  Administered 2014-05-24: 1000 mL via INTRAVENOUS

## 2014-05-24 NOTE — ED Notes (Signed)
MD at bedside. 

## 2014-05-24 NOTE — ED Notes (Signed)
MD at bedside. 

## 2014-05-24 NOTE — Discharge Instructions (Signed)

## 2014-05-24 NOTE — ED Notes (Addendum)
Headache x 4 days. Left arm has been numb since this am. Came to the ED alone.

## 2014-05-24 NOTE — ED Provider Notes (Signed)
CSN: [id number]     Arrival date & time 05/24/14  2111 History  This chart was scribed for Elwin Mocha, MD by Gwenyth Ober, ED Scribe. This patient was seen in room MH04/MH04 and the patient's care was started at 9:31 PM.    Chief Complaint  Patient presents with  . Headache   Patient is a 29 y.o. female presenting with headaches. The history is provided by the patient. No language interpreter was used.  Headache Pain location:  Occipital Quality: Throbbing. Radiates to:  L neck Severity currently:  Unable to specify Severity at highest:  Unable to specify Onset quality:  Gradual Duration:  4 days Timing:  Constant Progression:  Unchanged Chronicity:  New Similar to prior headaches: no   Relieved by:  Nothing Worsened by:  Nothing Ineffective treatments:  None tried Associated symptoms: nausea, neck pain, tingling and visual change   Associated symptoms: no cough and no fever     HPI Comments: Emily Howe is a 29 y.o. female, with no chronic medical conditions, who presents to the Emergency Department complaining of constant, throbbing, left-sided occipital HA that started 4 days ago. She states left-sided neck pain, blurred vision, nausea and left arm tingling as associated symptoms. Pt reports a family history of HTN. She denies recent changes in medication and a history of HA or migraines. She just finished menses. Pt denies ear pain, vomiting, trouble swallowing, fever, SOB and CP as associated symptoms.   Past Medical History  Diagnosis Date  . Headache    Past Surgical History  Procedure Laterality Date  . Appendectomy    . Tonsillectomy    . Tubal ligation    . Cholecystectomy     Family History  Problem Relation Age of Onset  . Diabetes Other   . Hypertension Other    History  Substance Use Topics  . Smoking status: Never Smoker   . Smokeless tobacco: Not on file  . Alcohol Use: No   OB History    No data available     Review of Systems   Constitutional: Negative for fever and chills.  Respiratory: Negative for cough and shortness of breath.   Gastrointestinal: Positive for nausea.  Musculoskeletal: Positive for neck pain.  Neurological: Positive for headaches.  All other systems reviewed and are negative.     Allergies  Review of patient's allergies indicates no known allergies.  Home Medications   Prior to Admission medications   Medication Sig Start Date End Date Taking? Authorizing Provider  SIMVASTATIN PO Take by mouth.   Yes Historical Provider, MD  HYDROcodone-acetaminophen (NORCO/VICODIN) 5-325 MG per tablet Take 1 tablet by mouth every 4 (four) hours as needed for moderate pain or severe pain. 06/27/13   Trixie Dredge, PA-C  ibuprofen (ADVIL,MOTRIN) 200 MG tablet Take 400 mg by mouth every 6 (six) hours as needed for mild pain.    Historical Provider, MD  ibuprofen (ADVIL,MOTRIN) 800 MG tablet Take 1 tablet (800 mg total) by mouth every 8 (eight) hours as needed for mild pain or moderate pain. 06/27/13   Trixie Dredge, PA-C  naproxen (NAPROSYN) 500 MG tablet Take 1 tablet (500 mg total) by mouth 2 (two) times daily. 10/20/13   Azalia Bilis, MD  ondansetron (ZOFRAN ODT) 8 MG disintegrating tablet 8mg  ODT q8 hours prn nausea 11/01/13   April Palumbo, MD  ondansetron (ZOFRAN) 4 MG tablet Take 1 tablet (4 mg total) by mouth every 8 (eight) hours as needed for nausea or vomiting. 06/27/13  Trixie Dredge, PA-C  promethazine (PHENERGAN) 12.5 MG suppository Place 1 suppository (12.5 mg total) rectally every 6 (six) hours as needed for nausea or vomiting. 11/01/13   April Palumbo, MD  propranolol (INDERAL) 10 MG tablet Take 10 mg by mouth 2 (two) times daily.    Historical Provider, MD  sucralfate (CARAFATE) 1 GM/10ML suspension Take 10 mLs (1 g total) by mouth 4 (four) times daily -  with meals and at bedtime. 11/01/13   April Palumbo, MD  sulfamethoxazole-trimethoprim (SEPTRA DS) 800-160 MG per tablet Take 1 tablet by mouth every 12  (twelve) hours. 10/20/13   Azalia Bilis, MD   LMP 05/20/2014 Physical Exam  Constitutional: She is oriented to person, place, and time. She appears well-developed and well-nourished. No distress.  HENT:  Head: Normocephalic.  Mouth/Throat: Oropharynx is clear and moist.  Eyes: Pupils are equal, round, and reactive to light.  Neck: Neck supple.  Cardiovascular: Normal rate, regular rhythm and normal heart sounds.   Pulmonary/Chest: Effort normal and breath sounds normal. No respiratory distress. She has no wheezes.  Abdominal: Soft. She exhibits no distension. There is no tenderness. There is no rebound and no guarding.  Musculoskeletal: She exhibits no edema or tenderness.  Neurological: She is alert and oriented to person, place, and time. No cranial nerve deficit or sensory deficit. She exhibits normal muscle tone. Coordination normal. GCS eye subscore is 4. GCS verbal subscore is 5. GCS motor subscore is 6.  Skin: Skin is warm and dry.  Psychiatric: She has a normal mood and affect.  Nursing note and vitals reviewed.   ED Course  Procedures   DIAGNOSTIC STUDIES: Oxygen Saturation is 99% on room air, normal by my interpretation.    COORDINATION OF CARE: 9:22 PM Discussed treatment plan with pt at bedside and pt agreed to plan.   Labs Review Labs Reviewed - No data to display  Imaging Review Ct Head Wo Contrast  05/24/2014   CLINICAL DATA:  Left-sided occipital headache starting 4 days ago, with left-sided neck pain. Blurred vision, nausea and left arm tingling. Initial encounter.  EXAM: CT HEAD WITHOUT CONTRAST  TECHNIQUE: Contiguous axial images were obtained from the base of the skull through the vertex without intravenous contrast.  COMPARISON:  CT of the head performed 10/26/2013  FINDINGS: There is no evidence of acute infarction, mass lesion, or intra- or extra-axial hemorrhage on CT.  The posterior fossa, including the cerebellum, brainstem and fourth ventricle, is within  normal limits. The third and lateral ventricles, and basal ganglia are unremarkable in appearance. The cerebral hemispheres are symmetric in appearance, with normal gray-white differentiation. No mass effect or midline shift is seen.  There is no evidence of fracture; visualized osseous structures are unremarkable in appearance. The visualized portions of the orbits are within normal limits. The paranasal sinuses and mastoid air cells are well-aerated. No significant soft tissue abnormalities are seen.  IMPRESSION: Unremarkable noncontrast CT of the head.   Electronically Signed   By: Roanna Raider M.D.   On: 05/24/2014 22:32     EKG Interpretation None      MDM   Final diagnoses:  Headache  Complicated migraine    85F here with headache for 4 days. Left-sided, throbbing, and posterior and inner head. Search the base of her neck. No injury. Awoke with this headache. Did not start after rolling over, reaching overhead, any injury. Had some associated blurry vision, nausea, vomiting. No history of migraines. Here vitals are stable. She has a  normal neurologic exam by me. I suspect this is a migraine. She is 29 years old and didn't have any acute injury. I doubt carotid dissection. Will start with head CT since never had headaches. Will start with migraine cocktail.  Feeling much better after headache cocktail. Left arm tingling resolved. Stable for discharge. Likely had a complicated migraine.   I personally performed the services described in this documentation, which was scribed in my presence. The recorded information has been reviewed and is accurate.     Elwin Mocha, MD 05/25/14 Marlyne Beards

## 2014-09-18 ENCOUNTER — Emergency Department (HOSPITAL_BASED_OUTPATIENT_CLINIC_OR_DEPARTMENT_OTHER)
Admission: EM | Admit: 2014-09-18 | Discharge: 2014-09-18 | Disposition: A | Discharge status: Home / Self Care | Payer: BLUE CROSS/BLUE SHIELD | Attending: Emergency Medicine | Admitting: Emergency Medicine

## 2014-09-18 ENCOUNTER — Encounter (HOSPITAL_BASED_OUTPATIENT_CLINIC_OR_DEPARTMENT_OTHER): Payer: Self-pay | Admitting: Emergency Medicine

## 2014-09-18 DIAGNOSIS — G43809 Other migraine, not intractable, without status migrainosus: Secondary | ICD-10-CM | POA: Diagnosis not present

## 2014-09-18 DIAGNOSIS — Z87891 Personal history of nicotine dependence: Secondary | ICD-10-CM | POA: Insufficient documentation

## 2014-09-18 DIAGNOSIS — G43909 Migraine, unspecified, not intractable, without status migrainosus: Secondary | ICD-10-CM | POA: Diagnosis present

## 2014-09-18 DIAGNOSIS — Z79899 Other long term (current) drug therapy: Secondary | ICD-10-CM | POA: Diagnosis not present

## 2014-09-18 DIAGNOSIS — Z791 Long term (current) use of non-steroidal anti-inflammatories (NSAID): Secondary | ICD-10-CM | POA: Insufficient documentation

## 2014-09-18 MED ORDER — SODIUM CHLORIDE 0.9 % IV BOLUS (SEPSIS)
1000.0000 mL | Freq: Once | INTRAVENOUS | Status: AC
  Administered 2014-09-18: 1000 mL via INTRAVENOUS

## 2014-09-18 MED ORDER — MORPHINE SULFATE 4 MG/ML IJ SOLN
4.0000 mg | Freq: Once | INTRAMUSCULAR | Status: AC
  Administered 2014-09-18: 4 mg via INTRAVENOUS
  Filled 2014-09-18: qty 1

## 2014-09-18 MED ORDER — DIPHENHYDRAMINE HCL 50 MG/ML IJ SOLN
25.0000 mg | Freq: Once | INTRAMUSCULAR | Status: AC
  Administered 2014-09-18: 25 mg via INTRAVENOUS
  Filled 2014-09-18: qty 1

## 2014-09-18 MED ORDER — PROCHLORPERAZINE EDISYLATE 5 MG/ML IJ SOLN
10.0000 mg | Freq: Four times a day (QID) | INTRAMUSCULAR | Status: DC | PRN
  Administered 2014-09-18: 10 mg via INTRAVENOUS
  Filled 2014-09-18: qty 2

## 2014-09-18 MED ORDER — KETOROLAC TROMETHAMINE 30 MG/ML IJ SOLN
30.0000 mg | Freq: Once | INTRAMUSCULAR | Status: AC
  Administered 2014-09-18: 30 mg via INTRAVENOUS
  Filled 2014-09-18: qty 1

## 2014-09-18 NOTE — ED Notes (Signed)
Pt reports progressively worsening headache onset @ 4 hrs ago. Pt also reports recent hx of LOC on 09/15/2014 and was treated ad released from,[facility]

## 2014-09-18 NOTE — Discharge Instructions (Signed)

## 2014-09-18 NOTE — ED Provider Notes (Signed)
CSN: [id number]     Arrival date & time 09/18/14  4782 History   First MD Initiated Contact with Patient 09/18/14 [phone]     Chief Complaint  Patient presents with  . Migraine     (Consider location/radiation/quality/duration/timing/severity/associated sxs/prior Treatment) The history is provided by the patient.   patient reports worsening headache over the past 4 hours.  She has a history of headaches including migraine headaches.  She tried anti-inflammatories without improvement in her symptoms.  She does report minor head injury on 09/15/2014 at which point she presented to an outside emergency department and reports that she had a "normal head CT".  She denies neck pain.  She denies fever or chills.  No chest pain or shortness of breath.  No cough.  She denies weakness of her arms or legs.  No change in her vision.  She states the headache is worse with light.  Past Medical History  Diagnosis Date  . Headache    Past Surgical History  Procedure Laterality Date  . Appendectomy    . Tonsillectomy    . Tubal ligation    . Cholecystectomy     Family History  Problem Relation Age of Onset  . Diabetes Other   . Hypertension Other    History  Substance Use Topics  . Smoking status: Former Games developer  . Smokeless tobacco: Not on file  . Alcohol Use: No   OB History    No data available     Review of Systems  All other systems reviewed and are negative.     Allergies  Review of patient's allergies indicates no known allergies.  Home Medications   Prior to Admission medications   Medication Sig Start Date End Date Taking? Authorizing Provider  HYDROcodone-acetaminophen (NORCO/VICODIN) 5-325 MG per tablet Take 1 tablet by mouth every 4 (four) hours as needed for moderate pain or severe pain. 06/27/13   Trixie Dredge, PA-C  ibuprofen (ADVIL,MOTRIN) 200 MG tablet Take 400 mg by mouth every 6 (six) hours as needed for mild pain.    Historical Provider, MD  ibuprofen (ADVIL,MOTRIN)  800 MG tablet Take 1 tablet (800 mg total) by mouth every 8 (eight) hours as needed for mild pain or moderate pain. 06/27/13   Trixie Dredge, PA-C  naproxen (NAPROSYN) 500 MG tablet Take 1 tablet (500 mg total) by mouth 2 (two) times daily. 10/20/13   Azalia Bilis, MD  ondansetron (ZOFRAN ODT) 8 MG disintegrating tablet 8mg  ODT q8 hours prn nausea 11/01/13   April Palumbo, MD  ondansetron (ZOFRAN) 4 MG tablet Take 1 tablet (4 mg total) by mouth every 8 (eight) hours as needed for nausea or vomiting. 06/27/13   Trixie Dredge, PA-C  promethazine (PHENERGAN) 12.5 MG suppository Place 1 suppository (12.5 mg total) rectally every 6 (six) hours as needed for nausea or vomiting. 11/01/13   April Palumbo, MD  propranolol (INDERAL) 10 MG tablet Take 10 mg by mouth 2 (two) times daily.    Historical Provider, MD  SIMVASTATIN PO Take by mouth.    Historical Provider, MD  sucralfate (CARAFATE) 1 GM/10ML suspension Take 10 mLs (1 g total) by mouth 4 (four) times daily -  with meals and at bedtime. 11/01/13   April Palumbo, MD  sulfamethoxazole-trimethoprim (SEPTRA DS) 800-160 MG per tablet Take 1 tablet by mouth every 12 (twelve) hours. 10/20/13   Azalia Bilis, MD   BP 129/81 mmHg  Pulse 78  Temp(Src) 97.4 F (36.3 C) (Oral)  Resp 16  Ht   (1.6 m)  Wt 220 lb (99.791 kg)  BMI 38.98 kg/m2  SpO2 98%  LMP 09/17/2014 Physical Exam  Constitutional: She is oriented to person, place, and time. She appears well-developed and well-nourished. No distress.  HENT:  Head: Normocephalic and atraumatic.  Eyes: EOM are normal. Pupils are equal, round, and reactive to light.  Neck: Normal range of motion.  Cardiovascular: Normal rate, regular rhythm and normal heart sounds.   Pulmonary/Chest: Effort normal and breath sounds normal.  Abdominal: Soft. She exhibits no distension. There is no tenderness.  Musculoskeletal: Normal range of motion.  Neurological: She is alert and oriented to person, place, and time.  5/5 strength  in major muscle groups of  bilateral upper and lower extremities. Speech normal. No facial asymetry.   Skin: Skin is warm and dry.  Psychiatric: She has a normal mood and affect. Judgment normal.  Nursing note and vitals reviewed.   ED Course  Procedures (including critical care time) Labs Review Labs Reviewed - No data to display  Imaging Review No results found.   EKG Interpretation None      MDM   Final diagnoses:  Other type of migraine    Typical migraine headache for the pt. Non focal neuro exam. No recent head trauma. No fever. Doubt meningitis. Doubt intracranial bleed. Doubt normal pressure hydrocephalus. No indication for imaging. Will treat with migraine cocktail and reevaluate  Improvement of symptoms after headache cocktail.  Discharge home in good condition.    Azalia Bilis, MD 09/18/14 [phone]

## 2015-04-04 ENCOUNTER — Emergency Department (HOSPITAL_BASED_OUTPATIENT_CLINIC_OR_DEPARTMENT_OTHER)
Admission: EM | Admit: 2015-04-04 | Discharge: 2015-04-05 | Disposition: A | Discharge status: Home / Self Care | Payer: BLUE CROSS/BLUE SHIELD | Attending: Emergency Medicine | Admitting: Emergency Medicine

## 2015-04-04 ENCOUNTER — Encounter (HOSPITAL_BASED_OUTPATIENT_CLINIC_OR_DEPARTMENT_OTHER): Payer: Self-pay | Admitting: *Deleted

## 2015-04-04 DIAGNOSIS — Z791 Long term (current) use of non-steroidal anti-inflammatories (NSAID): Secondary | ICD-10-CM | POA: Diagnosis not present

## 2015-04-04 DIAGNOSIS — R195 Other fecal abnormalities: Secondary | ICD-10-CM | POA: Diagnosis not present

## 2015-04-04 DIAGNOSIS — Z87891 Personal history of nicotine dependence: Secondary | ICD-10-CM | POA: Insufficient documentation

## 2015-04-04 DIAGNOSIS — M25512 Pain in left shoulder: Secondary | ICD-10-CM | POA: Insufficient documentation

## 2015-04-04 DIAGNOSIS — R200 Anesthesia of skin: Secondary | ICD-10-CM | POA: Insufficient documentation

## 2015-04-04 DIAGNOSIS — Z8669 Personal history of other diseases of the nervous system and sense organs: Secondary | ICD-10-CM | POA: Insufficient documentation

## 2015-04-04 DIAGNOSIS — R109 Unspecified abdominal pain: Secondary | ICD-10-CM | POA: Diagnosis present

## 2015-04-04 DIAGNOSIS — Z3202 Encounter for pregnancy test, result negative: Secondary | ICD-10-CM | POA: Insufficient documentation

## 2015-04-04 DIAGNOSIS — M25511 Pain in right shoulder: Secondary | ICD-10-CM | POA: Insufficient documentation

## 2015-04-04 DIAGNOSIS — Z79899 Other long term (current) drug therapy: Secondary | ICD-10-CM | POA: Diagnosis not present

## 2015-04-04 DIAGNOSIS — M542 Cervicalgia: Secondary | ICD-10-CM | POA: Diagnosis not present

## 2015-04-04 DIAGNOSIS — K2970 Gastritis, unspecified, without bleeding: Secondary | ICD-10-CM | POA: Diagnosis not present

## 2015-04-04 HISTORY — DX: Multiple sclerosis, unspecified: G35.D

## 2015-04-04 HISTORY — DX: Multiple sclerosis: G35

## 2015-04-04 LAB — CBC WITH DIFFERENTIAL/PLATELET
Basophils Absolute: 0 10*3/uL (ref 0.0–0.1)
Basophils Relative: 0 %
EOS ABS: 0.2 10*3/uL (ref 0.0–0.7)
EOS PCT: 1 %
HEMATOCRIT: 37.4 % (ref 36.0–46.0)
Hemoglobin: 12.8 g/dL (ref 12.0–15.0)
LYMPHS ABS: 5 10*3/uL — AB (ref 0.7–4.0)
LYMPHS PCT: 31 %
MCH: 28.8 pg (ref 26.0–34.0)
MCHC: 34.2 g/dL (ref 30.0–36.0)
MCV: 84 fL (ref 78.0–100.0)
MONOS PCT: 6 %
Monocytes Absolute: 1 10*3/uL (ref 0.1–1.0)
NEUTROS PCT: 62 %
Neutro Abs: 10.3 10*3/uL — ABNORMAL HIGH (ref 1.7–7.7)
PLATELETS: 274 10*3/uL (ref 150–400)
RBC: 4.45 MIL/uL (ref 3.87–5.11)
RDW: 12.9 % (ref 11.5–15.5)
WBC: 16.5 10*3/uL — AB (ref 4.0–10.5)

## 2015-04-04 LAB — URINALYSIS, ROUTINE W REFLEX MICROSCOPIC
BILIRUBIN URINE: NEGATIVE
Glucose, UA: NEGATIVE mg/dL
Hgb urine dipstick: NEGATIVE
Ketones, ur: NEGATIVE mg/dL
Leukocytes, UA: NEGATIVE
NITRITE: NEGATIVE
PROTEIN: NEGATIVE mg/dL
SPECIFIC GRAVITY, URINE: 1.021 (ref 1.005–1.030)
pH: 6.5 (ref 5.0–8.0)

## 2015-04-04 LAB — COMPREHENSIVE METABOLIC PANEL
ALT: 14 U/L (ref 14–54)
AST: 14 U/L — AB (ref 15–41)
Albumin: 3.5 g/dL (ref 3.5–5.0)
Alkaline Phosphatase: 32 U/L — ABNORMAL LOW (ref 38–126)
Anion gap: 8 (ref 5–15)
BILIRUBIN TOTAL: 0.4 mg/dL (ref 0.3–1.2)
BUN: 13 mg/dL (ref 6–20)
CALCIUM: 8.4 mg/dL — AB (ref 8.9–10.3)
CO2: 29 mmol/L (ref 22–32)
CREATININE: 0.57 mg/dL (ref 0.44–1.00)
Chloride: 101 mmol/L (ref 101–111)
Glucose, Bld: 89 mg/dL (ref 65–99)
Potassium: 3.1 mmol/L — ABNORMAL LOW (ref 3.5–5.1)
Sodium: 138 mmol/L (ref 135–145)
TOTAL PROTEIN: 6.1 g/dL — AB (ref 6.5–8.1)

## 2015-04-04 LAB — LIPASE, BLOOD: LIPASE: 20 U/L (ref 11–51)

## 2015-04-04 LAB — PREGNANCY, URINE: PREG TEST UR: NEGATIVE

## 2015-04-04 MED ORDER — OMEPRAZOLE 20 MG PO CPDR: 20.0000 mg | DELAYED_RELEASE_CAPSULE | Freq: Two times a day (BID) | ORAL | Status: DC

## 2015-04-04 MED ORDER — PANTOPRAZOLE SODIUM 40 MG PO TBEC
40.0000 mg | DELAYED_RELEASE_TABLET | Freq: Once | ORAL | Status: AC
  Administered 2015-04-04: 40 mg via ORAL
  Filled 2015-04-04: qty 1

## 2015-04-04 MED ORDER — ONDANSETRON 4 MG PO TBDP: 4.0000 mg | ORAL_TABLET | Freq: Three times a day (TID) | ORAL | Status: DC | PRN

## 2015-04-04 MED ORDER — POTASSIUM CHLORIDE CRYS ER 20 MEQ PO TBCR
40.0000 meq | EXTENDED_RELEASE_TABLET | Freq: Once | ORAL | Status: AC
  Administered 2015-04-05: 40 meq via ORAL
  Filled 2015-04-04: qty 2

## 2015-04-04 MED ORDER — ONDANSETRON HCL 4 MG/2ML IJ SOLN
4.0000 mg | Freq: Once | INTRAMUSCULAR | Status: DC
  Filled 2015-04-04: qty 2

## 2015-04-04 MED ORDER — ONDANSETRON 4 MG PO TBDP
4.0000 mg | ORAL_TABLET | Freq: Once | ORAL | Status: AC
  Administered 2015-04-04: 4 mg via ORAL
  Filled 2015-04-04: qty 1

## 2015-04-04 MED ORDER — HYDROCODONE-ACETAMINOPHEN 5-325 MG PO TABS: 2.0000 | ORAL_TABLET | ORAL | Status: DC | PRN

## 2015-04-04 NOTE — ED Notes (Signed)
MD at bedside. 

## 2015-04-04 NOTE — Discharge Instructions (Signed)

## 2015-04-04 NOTE — ED Provider Notes (Addendum)
CSN: [id number]     Arrival date & time 04/04/15  1834 History  By signing my name below, I, Marisue Humble, attest that this documentation has been prepared under the direction and in the presence of Rolland Porter, MD . Electronically Signed: Marisue Humble, Scribe. 04/04/2015. 9:21 PM.     Chief Complaint  Patient presents with  . Abdominal Pain  . Neck Pain   The history is provided by the patient. No language interpreter was used.   HPI Comments:  Emily Howe is a 30 y.o. female with a history of headache and MS who presents to the Emergency Department complaining of sharp, stabbing, waxing and waning abdominal pain onset five days ago. She states the pain is worse with anything PO.  Pt reports recent dx of MS by her neurologist one week ago; she was initially evaluated for eye pain and vision change and had an MRI which showed lesions in her eyes and brain. Pt also reports associated left side numbness, nausea, near syncope, current hematochezia onset three days ago, pain with BM, and "stabbing" neck pain. Pt denies any recent trauma. No alleviating factors noted. Pt states she is a non-smoker, and denies alcohol and caffeine intake. She takes Ibuprofen once every 8 hours every day for headaches.   Past Medical History  Diagnosis Date  . Headache   . MS (multiple sclerosis) [facility])    Past Surgical History  Procedure Laterality Date  . Appendectomy    . Tonsillectomy    . Tubal ligation    . Cholecystectomy     Family History  Problem Relation Age of Onset  . Diabetes Other   . Hypertension Other    Social History  Substance Use Topics  . Smoking status: Former Games developer  . Smokeless tobacco: Never Used  . Alcohol Use: No   OB History    No data available     Review of Systems  Constitutional: Negative for fever, chills, diaphoresis, appetite change and fatigue.  HENT: Negative for mouth sores, sore throat and trouble swallowing.   Eyes: Negative for visual disturbance.   Respiratory: Negative for cough, chest tightness, shortness of breath and wheezing.   Cardiovascular: Negative for chest pain.  Gastrointestinal: Positive for abdominal pain and blood in stool. Negative for nausea, vomiting, diarrhea and abdominal distention.       + pain w/ BM  Endocrine: Negative for polydipsia, polyphagia and polyuria.  Genitourinary: Negative for dysuria, frequency and hematuria.  Musculoskeletal: Positive for neck pain. Negative for gait problem.  Skin: Negative for color change, pallor and rash.  Neurological: Positive for numbness. Negative for dizziness, syncope, light-headedness and headaches.  Hematological: Does not bruise/bleed easily.  Psychiatric/Behavioral: Negative for behavioral problems and confusion.   Allergies  Doxycycline  Home Medications   Prior to Admission medications   Medication Sig Start Date End Date Taking? Authorizing Provider  ibuprofen (ADVIL,MOTRIN) 200 MG tablet Take 400 mg by mouth every 6 (six) hours as needed for mild pain.   Yes Historical Provider, MD  propranolol (INDERAL) 10 MG tablet Take 10 mg by mouth 2 (two) times daily.   Yes Historical Provider, MD  HYDROcodone-acetaminophen (NORCO/VICODIN) 5-325 MG tablet Take 2 tablets by mouth every 4 (four) hours as needed. 04/04/15   Rolland Porter, MD  ibuprofen (ADVIL,MOTRIN) 800 MG tablet Take 1 tablet (800 mg total) by mouth every 8 (eight) hours as needed for mild pain or moderate pain. 06/27/13   Trixie Dredge, PA-C  naproxen (NAPROSYN) 500 MG  tablet Take 1 tablet (500 mg total) by mouth 2 (two) times daily. 10/20/13   Azalia Bilis, MD  omeprazole (PRILOSEC) 20 MG capsule Take 1 capsule (20 mg total) by mouth 2 (two) times daily. 04/04/15   Rolland Porter, MD  ondansetron (ZOFRAN ODT) 4 MG disintegrating tablet Take 1 tablet (4 mg total) by mouth every 8 (eight) hours as needed for nausea. 04/04/15   Rolland Porter, MD  ondansetron (ZOFRAN) 4 MG tablet Take 1 tablet (4 mg total) by mouth every 8  (eight) hours as needed for nausea or vomiting. 06/27/13   Trixie Dredge, PA-C  promethazine (PHENERGAN) 12.5 MG suppository Place 1 suppository (12.5 mg total) rectally every 6 (six) hours as needed for nausea or vomiting. 11/01/13   April Palumbo, MD  SIMVASTATIN PO Take by mouth.    Historical Provider, MD  sucralfate (CARAFATE) 1 GM/10ML suspension Take 10 mLs (1 g total) by mouth 4 (four) times daily -  with meals and at bedtime. 11/01/13   April Palumbo, MD  sulfamethoxazole-trimethoprim (SEPTRA DS) 800-160 MG per tablet Take 1 tablet by mouth every 12 (twelve) hours. 10/20/13   Azalia Bilis, MD   BP 110/62 mmHg  Pulse 64  Temp(Src) 98.6 F (37 C) (Oral)  Resp 18  Ht 5\' 3"  (1.6 m)  Wt 224 lb (101.606 kg)  BMI 39.69 kg/m2  SpO2 100%  LMP 03/15/2015 Physical Exam  Constitutional: She is oriented to person, place, and time. She appears well-developed and well-nourished. No distress.  HENT:  Head: Normocephalic.  Eyes: Conjunctivae are normal. Pupils are equal, round, and reactive to light. No scleral icterus.  Neck: Normal range of motion. Neck supple. No thyromegaly present.  Paraspinal and b/l shoulder tenderness. No lhermittes sign.   Cardiovascular: Normal rate and regular rhythm.  Exam reveals no gallop and no friction rub.   No murmur heard. Pulmonary/Chest: Effort normal and breath sounds normal. No respiratory distress. She has no wheezes. She has no rales.  Abdominal: Soft. Bowel sounds are normal. She exhibits no distension. There is no tenderness. There is no rebound.  Tenderness to epigastric are and diffuse pain.  Musculoskeletal: Normal range of motion.  Neurological: She is alert and oriented to person, place, and time.  No radicular symptoms with neck flexion.  Skin: Skin is warm and dry. No rash noted.  Psychiatric: She has a normal mood and affect. Her behavior is normal.    ED Course  Procedures  DIAGNOSTIC STUDIES: Oxygen Saturation is 100% on RA, normal by my  interpretation.    COORDINATION OF CARE: 8:37 PM Will administer medication for pain and nausea. Discussed treatment plan with pt at bedside and pt agreed to plan.  Labs Review Labs Reviewed  CBC WITH DIFFERENTIAL/PLATELET - Abnormal; Notable for the following:    WBC 16.5 (*)    Neutro Abs 10.3 (*)    Lymphs Abs 5.0 (*)    All other components within normal limits  COMPREHENSIVE METABOLIC PANEL - Abnormal; Notable for the following:    Potassium 3.1 (*)    Calcium 8.4 (*)    Total Protein 6.1 (*)    AST 14 (*)    Alkaline Phosphatase 32 (*)    All other components within normal limits  URINALYSIS, ROUTINE W REFLEX MICROSCOPIC (NOT AT [facility])  PREGNANCY, URINE  LIPASE, BLOOD    Imaging Review No results found. I have personally reviewed and evaluated these images and lab results as part of my medical decision-making.   EKG  Interpretation None      MDM   Final diagnoses:  Gastritis   Likely gastritis from her recent high-dose steroids. Additional studies pending. Declines pain medicine here. Awaiting additional studies.   I personally performed the services described in this documentation, which was scribed in my presence. The recorded information has been reviewed and is accurate.  Rolland Porter, MD 04/13/15 Leanord Hawking  Rolland Porter, MD 04/13/15 [phone]

## 2015-04-04 NOTE — ED Notes (Signed)
Pt reports on Saturday she completed 5 days of solumedrol infusions for MS. Yesterday began having neck pain and abd pain. Vomited x 2

## 2015-04-04 NOTE — ED Provider Notes (Signed)
11:45 PM  Assumed care from Dr. Fayrene Fearing.  Pt is a 30 y.o. female recently diagnosed with multiple sclerosis who has been on high-dose infusions of Solu-Medrol. Began having abdominal pain and vomiting. Dr. Fayrene Fearing suspected symptoms secondary to gastritis from steroids. Patient declined pain medication in the emergency department. Labs were pending. Patient does have a leukocytosis of 16.5 but this is likely from her steroids. She is afebrile. Electrolytes normal except a slightly low potassium at 3.1 which I will replace. LFTs, lipase normal. Urine shows no sign of infection, no acute head. Pregnancy test negative. Plan was to discharge patient home on Prilosec and with Zofran and Vicodin as needed.  Pt has outpatient follow up.  Layla Maw Foye Haggart, DO 04/04/15 2351

## 2015-06-28 ENCOUNTER — Encounter (HOSPITAL_BASED_OUTPATIENT_CLINIC_OR_DEPARTMENT_OTHER): Payer: Self-pay | Admitting: *Deleted

## 2015-06-28 ENCOUNTER — Emergency Department (HOSPITAL_BASED_OUTPATIENT_CLINIC_OR_DEPARTMENT_OTHER)
Admission: EM | Admit: 2015-06-28 | Discharge: 2015-06-29 | Disposition: A | Discharge status: Home / Self Care | Payer: BLUE CROSS/BLUE SHIELD | Attending: Emergency Medicine | Admitting: Emergency Medicine

## 2015-06-28 ENCOUNTER — Emergency Department (HOSPITAL_BASED_OUTPATIENT_CLINIC_OR_DEPARTMENT_OTHER): Payer: BLUE CROSS/BLUE SHIELD

## 2015-06-28 DIAGNOSIS — M25512 Pain in left shoulder: Secondary | ICD-10-CM | POA: Insufficient documentation

## 2015-06-28 DIAGNOSIS — Z3202 Encounter for pregnancy test, result negative: Secondary | ICD-10-CM | POA: Diagnosis not present

## 2015-06-28 DIAGNOSIS — Z79899 Other long term (current) drug therapy: Secondary | ICD-10-CM | POA: Insufficient documentation

## 2015-06-28 DIAGNOSIS — Z791 Long term (current) use of non-steroidal anti-inflammatories (NSAID): Secondary | ICD-10-CM | POA: Insufficient documentation

## 2015-06-28 DIAGNOSIS — Z87891 Personal history of nicotine dependence: Secondary | ICD-10-CM | POA: Diagnosis not present

## 2015-06-28 DIAGNOSIS — R079 Chest pain, unspecified: Secondary | ICD-10-CM | POA: Diagnosis not present

## 2015-06-28 DIAGNOSIS — R110 Nausea: Secondary | ICD-10-CM | POA: Insufficient documentation

## 2015-06-28 DIAGNOSIS — Z8669 Personal history of other diseases of the nervous system and sense organs: Secondary | ICD-10-CM | POA: Diagnosis not present

## 2015-06-28 DIAGNOSIS — D649 Anemia, unspecified: Secondary | ICD-10-CM | POA: Insufficient documentation

## 2015-06-28 DIAGNOSIS — R0602 Shortness of breath: Secondary | ICD-10-CM | POA: Diagnosis not present

## 2015-06-28 LAB — CBC WITH DIFFERENTIAL/PLATELET
Basophils Absolute: 0 10*3/uL (ref 0.0–0.1)
Basophils Relative: 0 %
Eosinophils Absolute: 0.2 10*3/uL (ref 0.0–0.7)
Eosinophils Relative: 2 %
HCT: 33 % — ABNORMAL LOW (ref 36.0–46.0)
Hemoglobin: 11.1 g/dL — ABNORMAL LOW (ref 12.0–15.0)
Lymphocytes Relative: 32 %
Lymphs Abs: 3 10*3/uL (ref 0.7–4.0)
MCH: 28.6 pg (ref 26.0–34.0)
MCHC: 33.6 g/dL (ref 30.0–36.0)
MCV: 85.1 fL (ref 78.0–100.0)
Monocytes Absolute: 0.7 10*3/uL (ref 0.1–1.0)
Monocytes Relative: 7 %
Neutro Abs: 5.5 10*3/uL (ref 1.7–7.7)
Neutrophils Relative %: 59 %
Platelets: 308 10*3/uL (ref 150–400)
RBC: 3.88 MIL/uL (ref 3.87–5.11)
RDW: 12.7 % (ref 11.5–15.5)
WBC: 9.4 10*3/uL (ref 4.0–10.5)

## 2015-06-28 LAB — BASIC METABOLIC PANEL
Anion gap: 6 (ref 5–15)
BUN: 15 mg/dL (ref 6–20)
CO2: 27 mmol/L (ref 22–32)
Calcium: 8.8 mg/dL — ABNORMAL LOW (ref 8.9–10.3)
Chloride: 107 mmol/L (ref 101–111)
Creatinine, Ser: 0.74 mg/dL (ref 0.44–1.00)
GFR calc Af Amer: >60 mL/min (ref >60)
GFR calc non Af Amer: >60 mL/min (ref >60)
Glucose, Bld: 94 mg/dL (ref 65–99)
Potassium: 3.7 mmol/L (ref 3.5–5.1)
Sodium: 140 mmol/L (ref 135–145)

## 2015-06-28 LAB — D-DIMER, QUANTITATIVE: D-Dimer, Quant: 0.37 ug/mL-FEU (ref 0.00–0.50)

## 2015-06-28 LAB — PREGNANCY, URINE: Preg Test, Ur: NEGATIVE

## 2015-06-28 LAB — TROPONIN I: Troponin I: <0.03 ng/mL (ref <0.031)

## 2015-06-28 MED ORDER — ONDANSETRON HCL 4 MG PO TABS: 4.0000 mg | ORAL_TABLET | Freq: Three times a day (TID) | ORAL | Status: DC | PRN

## 2015-06-28 MED ORDER — ONDANSETRON 8 MG PO TBDP
8.0000 mg | ORAL_TABLET | Freq: Once | ORAL | Status: AC
  Administered 2015-06-28: 8 mg via ORAL
  Filled 2015-06-28: qty 1

## 2015-06-28 MED ORDER — IBUPROFEN 400 MG PO TABS
600.0000 mg | ORAL_TABLET | Freq: Once | ORAL | Status: AC
  Administered 2015-06-28: 600 mg via ORAL
  Filled 2015-06-28: qty 1

## 2015-06-28 NOTE — Discharge Instructions (Signed)
Medications: Zofran  Treatment: You may take ibuprofen every 4-6 hours as needed for your chest pain. You may take Zofran every 8 hours as needed for nausea and vomiting.  Follow-up: Please follow-up with your primary care provider as soon as possible for further evaluation of your chest pain. Please return to emergency department if your pain becomes worse, or you develop any other new or concerning symptoms.   Nonspecific Chest Pain  Chest pain can be caused by many different conditions. There is always a chance that your pain could be related to something serious, such as a heart attack or a blood clot in your lungs. Chest pain can also be caused by conditions that are not life-threatening. If you have chest pain, it is very important to follow up with your health care provider. CAUSES  Chest pain can be caused by:  Heartburn.  Pneumonia or bronchitis.  Anxiety or stress.  Inflammation around your heart (pericarditis) or lung (pleuritis or pleurisy).  A blood clot in your lung.  A collapsed lung (pneumothorax). It can develop suddenly on its own (spontaneous pneumothorax) or from trauma to the chest.  Shingles infection (varicella-zoster virus).  Heart attack.  Damage to the bones, muscles, and cartilage that make up your chest wall. This can include:  Bruised bones due to injury.  Strained muscles or cartilage due to frequent or repeated coughing or overwork.  Fracture to one or more ribs.  Sore cartilage due to inflammation (costochondritis). RISK FACTORS  Risk factors for chest pain may include:  Activities that increase your risk for trauma or injury to your chest.  Respiratory infections or conditions that cause frequent coughing.  Medical conditions or overeating that can cause heartburn.  Heart disease or family history of heart disease.  Conditions or health behaviors that increase your risk of developing a blood clot.  Having had chicken pox (varicella  zoster). SIGNS AND SYMPTOMS Chest pain can feel like:  Burning or tingling on the surface of your chest or deep in your chest.  Crushing, pressure, aching, or squeezing pain.  Dull or sharp pain that is worse when you move, cough, or take a deep breath.  Pain that is also felt in your back, neck, shoulder, or arm, or pain that spreads to any of these areas. Your chest pain may come and go, or it may stay constant. DIAGNOSIS Lab tests or other studies may be needed to find the cause of your pain. Your health care provider may have you take a test called an ambulatory ECG (electrocardiogram). An ECG records your heartbeat patterns at the time the test is performed. You may also have other tests, such as:  Transthoracic echocardiogram (TTE). During echocardiography, sound waves are used to create a picture of all of the heart structures and to look at how blood flows through your heart.  Transesophageal echocardiogram (TEE).This is a more advanced imaging test that obtains images from inside your body. It allows your health care provider to see your heart in finer detail.  Cardiac monitoring. This allows your health care provider to monitor your heart rate and rhythm in real time.  Holter monitor. This is a portable device that records your heartbeat and can help to diagnose abnormal heartbeats. It allows your health care provider to track your heart activity for several days, if needed.  Stress tests. These can be done through exercise or by taking medicine that makes your heart beat more quickly.  Blood tests.  Imaging tests. TREATMENT  Your treatment depends on what is causing your chest pain. Treatment may include:  Medicines. These may include:  Acid blockers for heartburn.  Anti-inflammatory medicine.  Pain medicine for inflammatory conditions.  Antibiotic medicine, if an infection is present.  Medicines to dissolve blood clots.  Medicines to treat coronary artery  disease.  Supportive care for conditions that do not require medicines. This may include:  Resting.  Applying heat or cold packs to injured areas.  Limiting activities until pain decreases. HOME CARE INSTRUCTIONS  If you were prescribed an antibiotic medicine, finish it all even if you start to feel better.  Avoid any activities that bring on chest pain.  Do not use any tobacco products, including cigarettes, chewing tobacco, or electronic cigarettes. If you need help quitting, ask your health care provider.  Do not drink alcohol.  Take medicines only as directed by your health care provider.  Keep all follow-up visits as directed by your health care provider. This is important. This includes any further testing if your chest pain does not go away.  If heartburn is the cause for your chest pain, you may be told to keep your head raised (elevated) while sleeping. This reduces the chance that acid will go from your stomach into your esophagus.  Make lifestyle changes as directed by your health care provider. These may include:  Getting regular exercise. Ask your health care provider to suggest some activities that are safe for you.  Eating a heart-healthy diet. A registered dietitian can help you to learn healthy eating options.  Maintaining a healthy weight.  Managing diabetes, if necessary.  Reducing stress. SEEK MEDICAL CARE IF:  Your chest pain does not go away after treatment.  You have a rash with blisters on your chest.  You have a fever. SEEK IMMEDIATE MEDICAL CARE IF:   Your chest pain is worse.  You have an increasing cough, or you cough up blood.  You have severe abdominal pain.  You have severe weakness.  You faint.  You have chills.  You have sudden, unexplained chest discomfort.  You have sudden, unexplained discomfort in your arms, back, neck, or jaw.  You have shortness of breath at any time.  You suddenly start to sweat, or your skin gets  clammy.  You feel nauseous or you vomit.  You suddenly feel light-headed or dizzy.  Your heart begins to beat quickly, or it feels like it is skipping beats. These symptoms may represent a serious problem that is an emergency. Do not wait to see if the symptoms will go away. Get medical help right away. Call your local emergency services (911 in the [place] Do not drive yourself to the hospital.   This information is not intended to replace advice given to you by your health care provider. Make sure you discuss any questions you have with your health care provider.   Document Released: 12/05/2004 Document Revised: 03/18/2014 Document Reviewed: 10/01/2013 Elsevier Interactive Patient Education [employer]

## 2015-06-28 NOTE — ED Notes (Signed)
Pt c/o chest pain which radiates down left arm, SOB, nausea x 4 hrs

## 2015-06-28 NOTE — ED Provider Notes (Signed)
CSN: [id number]     Arrival date & time 06/28/15  1937 History   First MD Initiated Contact with Patient 06/28/15 2109     Chief Complaint  Patient presents with  . Chest Pain     (Consider location/radiation/quality/duration/timing/severity/associated sxs/prior Treatment) HPI Comments: Patient is a 30 year old female with history of MS who presents today with left-sided thorax and shoulder pain that began 5 hours ago. Patient reports that the pain is radiating down her left arm. She describes the pain throughout as throbbing. It has been constant since onset. The patient was sitting at rest at onset. Patient has had associated nausea and shortness of breath that has been constant since onset. Patient has not tried any medications at home. Patient denies any abdominal pain, numbness, paresthesias, dysuria. Patient denies any recent injury or heavy lifting. She denies any recent illness.  Patient is a 30 y.o. female presenting with chest pain. The history is provided by the patient.  Chest Pain Associated symptoms: shortness of breath   Associated symptoms: no abdominal pain, no back pain, no fever, no headache, no nausea and not vomiting     Past Medical History  Diagnosis Date  . Headache   . MS (multiple sclerosis) [facility])    Past Surgical History  Procedure Laterality Date  . Appendectomy    . Tonsillectomy    . Tubal ligation    . Cholecystectomy     Family History  Problem Relation Age of Onset  . Diabetes Other   . Hypertension Other    Social History  Substance Use Topics  . Smoking status: Former Games developer  . Smokeless tobacco: Never Used  . Alcohol Use: No   OB History    No data available     Review of Systems  Constitutional: Negative for fever and chills.  HENT: Negative for facial swelling and sore throat.   Respiratory: Positive for shortness of breath.   Cardiovascular: Positive for chest pain.  Gastrointestinal: Negative for nausea, vomiting and abdominal  pain.  Genitourinary: Negative for dysuria.  Musculoskeletal: Negative for back pain.  Skin: Negative for rash and wound.  Neurological: Negative for headaches.  Psychiatric/Behavioral: The patient is not nervous/anxious.       Allergies  Doxycycline  Home Medications   Prior to Admission medications   Medication Sig Start Date End Date Taking? Authorizing Provider  ibuprofen (ADVIL,MOTRIN) 200 MG tablet Take 400 mg by mouth every 6 (six) hours as needed for mild pain.    Historical Provider, MD  ibuprofen (ADVIL,MOTRIN) 800 MG tablet Take 1 tablet (800 mg total) by mouth every 8 (eight) hours as needed for mild pain or moderate pain. 06/27/13   Trixie Dredge, PA-C  naproxen (NAPROSYN) 500 MG tablet Take 1 tablet (500 mg total) by mouth 2 (two) times daily. 10/20/13   Azalia Bilis, MD  omeprazole (PRILOSEC) 20 MG capsule Take 1 capsule (20 mg total) by mouth 2 (two) times daily. 04/04/15   Rolland Porter, MD  ondansetron (ZOFRAN ODT) 4 MG disintegrating tablet Take 1 tablet (4 mg total) by mouth every 8 (eight) hours as needed for nausea. 04/04/15   Rolland Porter, MD  ondansetron (ZOFRAN) 4 MG tablet Take 1 tablet (4 mg total) by mouth every 8 (eight) hours as needed for nausea or vomiting. 06/28/15   Emi Holes, PA-C  promethazine (PHENERGAN) 12.5 MG suppository Place 1 suppository (12.5 mg total) rectally every 6 (six) hours as needed for nausea or vomiting. 11/01/13   April Palumbo,  MD  propranolol (INDERAL) 10 MG tablet Take 10 mg by mouth 2 (two) times daily.    Historical Provider, MD  SIMVASTATIN PO Take by mouth.    Historical Provider, MD  sucralfate (CARAFATE) 1 GM/10ML suspension Take 10 mLs (1 g total) by mouth 4 (four) times daily -  with meals and at bedtime. 11/01/13   April Palumbo, MD   BP 112/74 mmHg  Pulse 65  Temp(Src) 98.1 F (36.7 C)  Resp 16  Ht 5\' 3"  (1.6 m)  Wt 99.791 kg  BMI 38.98 kg/m2  SpO2 99%  LMP 06/26/2015 Physical Exam  Constitutional: She appears  well-developed and well-nourished. No distress.  HENT:  Head: Normocephalic and atraumatic.  Mouth/Throat: Oropharynx is clear and moist. No oropharyngeal exudate.  Eyes: Conjunctivae are normal. Pupils are equal, round, and reactive to light. Right eye exhibits no discharge. Left eye exhibits no discharge. No scleral icterus.  Neck: Normal range of motion. Neck supple. No thyromegaly present.  Cardiovascular: Normal rate, regular rhythm, normal heart sounds and intact distal pulses.  Exam reveals no gallop and no friction rub.   No murmur heard. Pulmonary/Chest: Effort normal and breath sounds normal. No stridor. No respiratory distress. She has no wheezes. She has no rales.  Abdominal: Soft. Bowel sounds are normal. She exhibits no distension. There is no tenderness. There is no rebound and no guarding.  Musculoskeletal: She exhibits no edema.       Arms: Tenderness elicited on palpation of left lateral thorax, left upper chest, and left upper shoulder as demarcated on image  Lymphadenopathy:    She has no cervical adenopathy.  Neurological: She is alert. Coordination normal.  Skin: Skin is warm and dry. No rash noted. She is not diaphoretic. No pallor.  Psychiatric: She has a normal mood and affect.  Nursing note and vitals reviewed.   ED Course  Procedures (including critical care time) Labs Review Labs Reviewed  BASIC METABOLIC PANEL - Abnormal; Notable for the following:    Calcium 8.8 (*)    All other components within normal limits  CBC WITH DIFFERENTIAL/PLATELET - Abnormal; Notable for the following:    Hemoglobin 11.1 (*)    HCT 33.0 (*)    All other components within normal limits  TROPONIN I  D-DIMER, QUANTITATIVE (NOT AT [facility])  PREGNANCY, URINE    Imaging Review Dg Chest 2 View  06/28/2015  CLINICAL DATA:  Left-sided chest pain with shortness of breath. EXAM: CHEST  2 VIEW COMPARISON:  10/03/2012 FINDINGS: Low lung volumes leading to bronchovascular crowding. The  cardiomediastinal contours are normal. Pulmonary vasculature is normal. No consolidation, pleural effusion, or pneumothorax. No acute osseous abnormalities are seen. IMPRESSION: Hypoventilatory chest.  No definite acute abnormality. Electronically Signed   By: Rubye Oaks M.D.   On: 06/28/2015 22:23   I have personally reviewed and evaluated these images and lab results as part of my medical decision-making.   EKG Interpretation   Date/Time:  Wednesday June 28 2015 19:44:03 EDT Ventricular Rate:  74 PR Interval:  152 QRS Duration: 88 QT Interval:  402 QTC Calculation: 446 R Axis:   -12 Text Interpretation:  Normal sinus rhythm Possible Anterior infarct , age  undetermined Abnormal ECG Confirmed by ZACKOWSKI  MD, SCOTT [id number]) on  06/28/2015 8:11:34 PM      MDM   Patient's pain improved with ibuprofen, nausea improved with Zofran in ED. Urine pregnancy negative. BMP unremarkable. CBC shows slight anemia, HGB 11.1. Troponin less than 0.03. D-dimer 0.37.  EKG shows NSR. Chest x-ray shows no definite acute abnormality. Discharged with close follow-up to PCP. Suspect musculoskeletal cause. Patient discharged with Zofran and encouraged to take ibuprofen for the pain. Patient discussed with Dr. Deretha Emory who is in agreement with plan.  Final diagnoses:  Chest pain, unspecified chest pain type  Nausea  Shortness of breath      Emi Holes, PA-C 06/29/15 1610  Vanetta Mulders, MD 06/29/15 9604

## 2015-10-01 ENCOUNTER — Other Ambulatory Visit: Payer: Self-pay

## 2015-10-01 ENCOUNTER — Emergency Department (HOSPITAL_BASED_OUTPATIENT_CLINIC_OR_DEPARTMENT_OTHER)
Admission: EM | Admit: 2015-10-01 | Discharge: 2015-10-01 | Disposition: A | Discharge status: Home / Self Care | Payer: BLUE CROSS/BLUE SHIELD | Attending: Emergency Medicine | Admitting: Emergency Medicine

## 2015-10-01 ENCOUNTER — Encounter (HOSPITAL_BASED_OUTPATIENT_CLINIC_OR_DEPARTMENT_OTHER): Payer: Self-pay | Admitting: *Deleted

## 2015-10-01 ENCOUNTER — Emergency Department (HOSPITAL_BASED_OUTPATIENT_CLINIC_OR_DEPARTMENT_OTHER): Payer: BLUE CROSS/BLUE SHIELD

## 2015-10-01 DIAGNOSIS — R079 Chest pain, unspecified: Secondary | ICD-10-CM

## 2015-10-01 DIAGNOSIS — R0789 Other chest pain: Secondary | ICD-10-CM | POA: Insufficient documentation

## 2015-10-01 DIAGNOSIS — R0602 Shortness of breath: Secondary | ICD-10-CM | POA: Diagnosis not present

## 2015-10-01 DIAGNOSIS — Z79899 Other long term (current) drug therapy: Secondary | ICD-10-CM | POA: Diagnosis not present

## 2015-10-01 DIAGNOSIS — F17210 Nicotine dependence, cigarettes, uncomplicated: Secondary | ICD-10-CM | POA: Diagnosis not present

## 2015-10-01 LAB — CBC
HCT: 34.9 % — ABNORMAL LOW (ref 36.0–46.0)
Hemoglobin: 11.5 g/dL — ABNORMAL LOW (ref 12.0–15.0)
MCH: 28.3 pg (ref 26.0–34.0)
MCHC: 33 g/dL (ref 30.0–36.0)
MCV: 85.7 fL (ref 78.0–100.0)
PLATELETS: 238 10*3/uL (ref 150–400)
RBC: 4.07 MIL/uL (ref 3.87–5.11)
RDW: 13.7 % (ref 11.5–15.5)
WBC: 9 10*3/uL (ref 4.0–10.5)

## 2015-10-01 LAB — BASIC METABOLIC PANEL
Anion gap: 7 (ref 5–15)
BUN: 12 mg/dL (ref 6–20)
CALCIUM: 9 mg/dL (ref 8.9–10.3)
CO2: 24 mmol/L (ref 22–32)
CREATININE: 0.69 mg/dL (ref 0.44–1.00)
Chloride: 107 mmol/L (ref 101–111)
GFR calc Af Amer: >60 mL/min (ref >60)
GFR calc non Af Amer: >60 mL/min (ref >60)
GLUCOSE: 91 mg/dL (ref 65–99)
Potassium: 3.3 mmol/L — ABNORMAL LOW (ref 3.5–5.1)
Sodium: 138 mmol/L (ref 135–145)

## 2015-10-01 LAB — URINALYSIS, ROUTINE W REFLEX MICROSCOPIC
Bilirubin Urine: NEGATIVE
Glucose, UA: NEGATIVE mg/dL
HGB URINE DIPSTICK: NEGATIVE
Ketones, ur: NEGATIVE mg/dL
LEUKOCYTES UA: NEGATIVE
NITRITE: NEGATIVE
PROTEIN: NEGATIVE mg/dL
SPECIFIC GRAVITY, URINE: 1.009 (ref 1.005–1.030)
pH: 5.5 (ref 5.0–8.0)

## 2015-10-01 LAB — TROPONIN I

## 2015-10-01 MED ORDER — POTASSIUM CHLORIDE CRYS ER 20 MEQ PO TBCR
40.0000 meq | EXTENDED_RELEASE_TABLET | Freq: Once | ORAL | Status: AC
  Administered 2015-10-01: 40 meq via ORAL
  Filled 2015-10-01: qty 2

## 2015-10-01 MED ORDER — POTASSIUM CHLORIDE CRYS ER 20 MEQ PO TBCR: 40.0000 meq | EXTENDED_RELEASE_TABLET | Freq: Two times a day (BID) | ORAL | 0 refills | Status: DC

## 2015-10-01 NOTE — ED Triage Notes (Signed)
Per pt report sudden chest pain this morning, to lt chest and down lt arm.

## 2015-10-01 NOTE — ED Notes (Signed)
Dr Mesner in room with patient now. 

## 2015-10-01 NOTE — ED Notes (Signed)
Pt given d/c instructions as per chart. Verbalizes understanding. No questions. Rx x 1 

## 2015-10-01 NOTE — ED Notes (Signed)
MD at bedside. 

## 2015-10-01 NOTE — ED Provider Notes (Signed)
[facility] Provider Note   CSN: [id number] Arrival date & time: 10/01/15  1242  First Provider Contact:  None       History   Chief Complaint Chief Complaint  Patient presents with  . Chest Pain    HPI Emily Howe is a 30 y.o. female.   Chest Pain   This is a new problem. The current episode started 3 to 5 hours ago. The problem occurs constantly. The problem has been gradually improving. The pain is associated with lifting, movement, raising an arm and breathing. Pain location: left upper chest. The pain is mild. The quality of the pain is described as sharp and stabbing. The pain does not radiate. The symptoms are aggravated by certain positions. Associated symptoms include shortness of breath. Pertinent negatives include no abdominal pain, no dizziness, no exertional chest pressure, no headaches, no hemoptysis, no irregular heartbeat, no nausea, no orthopnea and no syncope.    Past Medical History:  Diagnosis Date  . Headache   . MS (multiple sclerosis) (HCC)     There are no active problems to display for this patient.   Past Surgical History:  Procedure Laterality Date  . APPENDECTOMY    . CHOLECYSTECTOMY    . TONSILLECTOMY    . TUBAL LIGATION      OB History    No data available       Home Medications    Prior to Admission medications   Medication Sig Start Date End Date Taking? Authorizing Provider  propranolol (INDERAL) 10 MG tablet Take 10 mg by mouth 2 (two) times daily.   Yes Historical Provider, MD  Teriflunomide (AUBAGIO) 14 MG TABS Take by mouth.   Yes Historical Provider, MD  potassium chloride SA (K-DUR,KLOR-CON) 20 MEQ tablet Take 2 tablets (40 mEq total) by mouth 2 (two) times daily. 10/01/15   Marily Memos, MD    Family History Family History  Problem Relation Age of Onset  . Diabetes Other   . Hypertension Other     Social History Social History  Substance Use Topics  . Smoking status: Current Every Day Smoker   Packs/day: 0.50    Types: Cigarettes  . Smokeless tobacco: Current User  . Alcohol use No     Allergies   Doxycycline   Review of Systems Review of Systems  Respiratory: Positive for shortness of breath. Negative for hemoptysis.   Cardiovascular: Positive for chest pain. Negative for orthopnea and syncope.  Gastrointestinal: Negative for abdominal pain and nausea.  Neurological: Negative for dizziness and headaches.  All other systems reviewed and are negative.    Physical Exam Updated Vital Signs BP 126/69 (BP Location: Right Arm)   Pulse 68   Temp 98.2 F (36.8 C) (Oral)   Resp 18   Ht  (1.6 m)   Wt 220 lb (99.8 kg)   LMP 09/04/2015   SpO2 100%   BMI 38.97 kg/m   Physical Exam  Constitutional: She is oriented to person, place, and time. She appears well-developed and well-nourished.  HENT:  Head: Normocephalic and atraumatic.  Neck: Normal range of motion.  Cardiovascular: Normal rate and regular rhythm.   Pulmonary/Chest: No stridor. No respiratory distress. She has no wheezes. She has no rales. She exhibits tenderness.  Abdominal: Soft. She exhibits no distension.  Musculoskeletal: Normal range of motion. She exhibits tenderness (left upper chest). She exhibits no edema or deformity.  Neurological: She is alert and oriented to person, place, and time.  Skin: Skin  is warm and dry.  Nursing note and vitals reviewed.    ED Treatments / Results  Labs (all labs ordered are listed, but only abnormal results are displayed) Labs Reviewed  BASIC METABOLIC PANEL - Abnormal; Notable for the following:       Result Value   Potassium 3.3 (*)    All other components within normal limits  CBC - Abnormal; Notable for the following:    Hemoglobin 11.5 (*)    HCT 34.9 (*)    All other components within normal limits  TROPONIN I  URINALYSIS, ROUTINE W REFLEX MICROSCOPIC (NOT AT [facility])    EKG  EKG Interpretation None       Radiology Dg Chest 2  View  Result Date: 10/01/2015 CLINICAL DATA:  30 year old female with sudden onset left chest pain radiating into the left arm EXAM: CHEST  2 VIEW COMPARISON:  Prior chest x-ray 06/28/2015 FINDINGS: The lungs are clear and negative for focal airspace consolidation, pulmonary edema or suspicious pulmonary nodule. No pleural effusion or pneumothorax. Cardiac and mediastinal contours are within normal limits. No acute fracture or lytic or blastic osseous lesions. The visualized upper abdominal bowel gas pattern is unremarkable. Surgical clips in the right upper quadrant suggest prior cholecystectomy. IMPRESSION: No active cardiopulmonary disease. Electronically Signed   By: Malachy Moan M.D.   On: 10/01/2015 13:13   Procedures Procedures (including critical care time)  Medications Ordered in ED Medications  potassium chloride SA (K-DUR,KLOR-CON) CR tablet 40 mEq (not administered)     Initial Impression / Assessment and Plan / ED Course  I have reviewed the triage vital signs and the nursing notes.  Pertinent labs & imaging results that were available during my care of the patient were reviewed by me and considered in my medical decision making (see chart for details).  Clinical Course    Nonspecific chest pain.  Low HEART score, atypical type pain, troponins negative, ecg unchanged without evidence of ischemia, all making ACS unlikely.  Low Well's Score, doubt PE.  No e/o Pneumonia, no indication for antibiotics.  Based on H&P, low concern for dissection, pneumothorax, pericarditis, boerhaves or other emergent causes for their symptoms at this time. Likely muscular.  Has hypokalemia, will fu w/ pcp.   Final Clinical Impressions(s) / ED Diagnoses   Final diagnoses:  Chest pain, unspecified chest pain type    New Prescriptions New Prescriptions   POTASSIUM CHLORIDE SA (K-DUR,KLOR-CON) 20 MEQ TABLET    Take 2 tablets (40 mEq total) by mouth 2 (two) times daily.     Marily Memos, MD 10/01/15 1550

## 2015-12-20 ENCOUNTER — Emergency Department (HOSPITAL_BASED_OUTPATIENT_CLINIC_OR_DEPARTMENT_OTHER)
Admission: EM | Admit: 2015-12-20 | Discharge: 2015-12-20 | Disposition: A | Discharge status: Home / Self Care | Payer: Self-pay | Attending: Emergency Medicine | Admitting: Emergency Medicine

## 2015-12-20 ENCOUNTER — Encounter (HOSPITAL_BASED_OUTPATIENT_CLINIC_OR_DEPARTMENT_OTHER): Payer: Self-pay | Admitting: *Deleted

## 2015-12-20 ENCOUNTER — Emergency Department (HOSPITAL_BASED_OUTPATIENT_CLINIC_OR_DEPARTMENT_OTHER): Payer: Self-pay

## 2015-12-20 DIAGNOSIS — J0110 Acute frontal sinusitis, unspecified: Secondary | ICD-10-CM | POA: Insufficient documentation

## 2015-12-20 DIAGNOSIS — T887XXA Unspecified adverse effect of drug or medicament, initial encounter: Secondary | ICD-10-CM | POA: Insufficient documentation

## 2015-12-20 DIAGNOSIS — R079 Chest pain, unspecified: Secondary | ICD-10-CM | POA: Insufficient documentation

## 2015-12-20 DIAGNOSIS — Z79899 Other long term (current) drug therapy: Secondary | ICD-10-CM | POA: Insufficient documentation

## 2015-12-20 DIAGNOSIS — Z87891 Personal history of nicotine dependence: Secondary | ICD-10-CM | POA: Insufficient documentation

## 2015-12-20 DIAGNOSIS — T50995A Adverse effect of other drugs, medicaments and biological substances, initial encounter: Secondary | ICD-10-CM | POA: Insufficient documentation

## 2015-12-20 DIAGNOSIS — Y829 Unspecified medical devices associated with adverse incidents: Secondary | ICD-10-CM | POA: Insufficient documentation

## 2015-12-20 DIAGNOSIS — T8090XA Unspecified complication following infusion and therapeutic injection, initial encounter: Secondary | ICD-10-CM

## 2015-12-20 MED ORDER — AMOXICILLIN-POT CLAVULANATE 875-125 MG PO TABS: 1.0000 | ORAL_TABLET | Freq: Two times a day (BID) | ORAL | 0 refills | Status: DC

## 2015-12-20 MED ORDER — AMOXICILLIN-POT CLAVULANATE 875-125 MG PO TABS
1.0000 | ORAL_TABLET | Freq: Once | ORAL | Status: AC
  Administered 2015-12-20: 1 via ORAL
  Filled 2015-12-20: qty 1

## 2015-12-20 NOTE — ED Provider Notes (Signed)
[facility]-EMER[facility]Y DEPT Provider Note   CSN: [id number] Arrival date & time: 12/20/15  0809     History   Chief Complaint Chief Complaint  Patient presents with  . Cough    HPI Emily Howe is a 30 y.o. female.   Cough  This is a new problem. The current episode started 2 days ago. The problem occurs constantly. The problem has not changed since onset.The cough is non-productive. Associated symptoms include chest pain, chills, rhinorrhea, shortness of breath and wheezing.    Past Medical History:  Diagnosis Date  . Headache   . MS (multiple sclerosis) (HCC)     There are no active problems to display for this patient.   Past Surgical History:  Procedure Laterality Date  . APPENDECTOMY    . CHOLECYSTECTOMY    . TONSILLECTOMY    . TUBAL LIGATION      OB History    No data available       Home Medications    Prior to Admission medications   Medication Sig Start Date End Date Taking? Authorizing Provider  Peginterferon Beta-1a (PLEGRIDY Alexis) Inject 160 mg into the skin.   Yes Historical Provider, MD  potassium chloride SA (K-DUR,KLOR-CON) 20 MEQ tablet Take 2 tablets (40 mEq total) by mouth 2 (two) times daily. 10/01/15  Yes Marily Memos, MD  propranolol (INDERAL) 10 MG tablet Take 10 mg by mouth 2 (two) times daily.   Yes Historical Provider, MD  amoxicillin-clavulanate (AUGMENTIN) 875-125 MG tablet Take 1 tablet by mouth 2 (two) times daily. One po bid x 7 days 12/20/15   Marily Memos, MD    Family History Family History  Problem Relation Age of Onset  . Diabetes Other   . Hypertension Other     Social History Social History  Substance Use Topics  . Smoking status: Former Smoker    Packs/day: 0.00    Quit date: 08/20/2015  . Smokeless tobacco: Current User  . Alcohol use No     Allergies   Doxycycline   Review of Systems Review of Systems  Constitutional: Positive for chills and fever.  HENT: Positive for congestion and rhinorrhea.     Respiratory: Positive for cough, shortness of breath and wheezing.   Cardiovascular: Positive for chest pain.  Endocrine: Negative for polydipsia and polyuria.  Genitourinary: Negative for flank pain and frequency.  Skin: Positive for rash.  All other systems reviewed and are negative.    Physical Exam Updated Vital Signs BP 139/74 (BP Location: Right Arm)   Pulse 78   Temp 97.7 F (36.5 C) (Oral)   Resp 16   Ht 5\' 3"  (1.6 m)   Wt 215 lb (97.5 kg)   LMP 12/06/2015   SpO2 100%   BMI 38.09 kg/m   Physical Exam  Constitutional: She is oriented to person, place, and time. She appears well-developed and well-nourished.  HENT:  Head: Normocephalic and atraumatic.  Right Ear: External ear normal.  Left Ear: External ear normal.  Frontal sinus ttp  Eyes: Conjunctivae and EOM are normal.  Neck: Normal range of motion.  Cardiovascular: Normal rate and regular rhythm.   Pulmonary/Chest: Effort normal. No stridor. No respiratory distress. She has rales (diffuse and fine).  Abdominal: She exhibits no distension.  Musculoskeletal: Normal range of motion. She exhibits no edema or deformity.  Neurological: She is alert and oriented to person, place, and time.  Skin: Skin is warm and dry. Rash (7-8 cm slightly warm indurated, tender area around injection site  on right lateral thigh) noted.  Nursing note and vitals reviewed.    ED Treatments / Results  Labs (all labs ordered are listed, but only abnormal results are displayed) Labs Reviewed - No data to display  EKG  EKG Interpretation None       Radiology Dg Chest 2 View  Result Date: 12/20/2015 CLINICAL DATA:  One week of cough, chest congestion, and fever. Nonsmoker. History of multiple scleroses. EXAM: CHEST  2 VIEW COMPARISON:  PA and lateral chest x-ray of October 01, 2015 FINDINGS: The lungs are reasonably well inflated. There is no focal infiltrate. There is no pleural effusion. The heart and pulmonary vascularity are  normal. The mediastinum is normal in width. The bony thorax is unremarkable. IMPRESSION: There is no evidence of pneumonia nor other acute cardiopulmonary abnormality. Electronically Signed   By: David  SwazilandJordan M.D.   On: 12/20/2015 09:24    Procedures Procedures (including critical care time)  Medications Ordered in ED Medications  amoxicillin-clavulanate (AUGMENTIN) 875-125 MG per tablet 1 tablet (1 tablet Oral Given 12/20/15 1005)     Initial Impression / Assessment and Plan / ED Course  I have reviewed the triage vital signs and the nursing notes.  Pertinent labs & imaging results that were available during my care of the patient were reviewed by me and considered in my medical decision making (see chart for details).  Clinical Course   Sinusitis, possible pneumonia, will XR. Will need antibiotics for sinus infection, at the least, since it has been going on for >7 days. Rash on right side is likely injection site reaction from her plegridy, I discussed this with her neurologist, Dr. Luberta Robertsonrowell, and [facility] who recommends stopping medication and following up with him in the office to change medications. I relayed this information to the patient    Final Clinical Impressions(s) / ED Diagnoses   Final diagnoses:  Acute frontal sinusitis, recurrence not specified  Injection site reaction, initial encounter    New Prescriptions Discharge Medication List as of 12/20/2015 10:00 AM    START taking these medications   Details  amoxicillin-clavulanate (AUGMENTIN) 875-125 MG tablet Take 1 tablet by mouth 2 (two) times daily. One po bid x 7 days, Starting Wed 12/20/2015, Print         Marily MemosJason Mayson Sterbenz, MD 12/21/15 [phone]

## 2015-12-20 NOTE — ED Triage Notes (Signed)
C/o cough with green sputum since last Wednesday, left ear pain,Also c/o pain in right thigh after shot for MS  On Friday and now right thigh is red and swollen.

## 2016-01-15 ENCOUNTER — Emergency Department (HOSPITAL_BASED_OUTPATIENT_CLINIC_OR_DEPARTMENT_OTHER): Payer: 59

## 2016-01-15 ENCOUNTER — Encounter (HOSPITAL_BASED_OUTPATIENT_CLINIC_OR_DEPARTMENT_OTHER): Payer: Self-pay | Admitting: *Deleted

## 2016-01-15 ENCOUNTER — Emergency Department (HOSPITAL_BASED_OUTPATIENT_CLINIC_OR_DEPARTMENT_OTHER)
Admission: EM | Admit: 2016-01-15 | Discharge: 2016-01-15 | Disposition: A | Discharge status: Home / Self Care | Payer: 59 | Attending: Emergency Medicine | Admitting: Emergency Medicine

## 2016-01-15 DIAGNOSIS — M79602 Pain in left arm: Secondary | ICD-10-CM | POA: Diagnosis not present

## 2016-01-15 DIAGNOSIS — R6884 Jaw pain: Secondary | ICD-10-CM | POA: Insufficient documentation

## 2016-01-15 DIAGNOSIS — R079 Chest pain, unspecified: Secondary | ICD-10-CM | POA: Diagnosis not present

## 2016-01-15 DIAGNOSIS — Z87891 Personal history of nicotine dependence: Secondary | ICD-10-CM | POA: Insufficient documentation

## 2016-01-15 LAB — CBC WITH DIFFERENTIAL/PLATELET
Basophils Absolute: 0 10*3/uL (ref 0.0–0.1)
Basophils Relative: 0 %
EOS ABS: 0.2 10*3/uL (ref 0.0–0.7)
Eosinophils Relative: 2 %
HCT: 34.8 % — ABNORMAL LOW (ref 36.0–46.0)
HEMOGLOBIN: 11.4 g/dL — AB (ref 12.0–15.0)
LYMPHS ABS: 2.4 10*3/uL (ref 0.7–4.0)
LYMPHS PCT: 25 %
MCH: 27.5 pg (ref 26.0–34.0)
MCHC: 32.8 g/dL (ref 30.0–36.0)
MCV: 84.1 fL (ref 78.0–100.0)
Monocytes Absolute: 0.6 10*3/uL (ref 0.1–1.0)
Monocytes Relative: 7 %
NEUTROS ABS: 6.3 10*3/uL (ref 1.7–7.7)
NEUTROS PCT: 66 %
Platelets: 296 10*3/uL (ref 150–400)
RBC: 4.14 MIL/uL (ref 3.87–5.11)
RDW: 13 % (ref 11.5–15.5)
WBC: 9.5 10*3/uL (ref 4.0–10.5)

## 2016-01-15 LAB — BASIC METABOLIC PANEL
ANION GAP: 9 (ref 5–15)
BUN: 10 mg/dL (ref 6–20)
CHLORIDE: 108 mmol/L (ref 101–111)
CO2: 22 mmol/L (ref 22–32)
Calcium: 9.1 mg/dL (ref 8.9–10.3)
Creatinine, Ser: 0.59 mg/dL (ref 0.44–1.00)
GFR calc non Af Amer: >60 mL/min (ref >60)
Glucose, Bld: 86 mg/dL (ref 65–99)
POTASSIUM: 3.7 mmol/L (ref 3.5–5.1)
SODIUM: 139 mmol/L (ref 135–145)

## 2016-01-15 LAB — TROPONIN I

## 2016-01-15 NOTE — Discharge Instructions (Signed)

## 2016-01-15 NOTE — ED Provider Notes (Signed)
Emergency Department Provider Note   I have reviewed the triage vital signs and the nursing notes.   HISTORY  Chief Complaint Arm Pain and Chest Pain   HPI Emily Howe is a 30 y.o. female with PMH of HA and MS presents to the emergency department for evaluation of left chest pain, jaw pain, arm discomfort over the last 3 days. She has had intermittent symptoms with no obvious provoking factor. She is tried taking aspirin with little to no relief. She has some associated URI symptoms which developed over the past 24 hours. No fevers or shaking chills. No productive cough. He has a family history of coronary artery disease. She notes that she is seen primary care physician the past for high blood pressure and irregular heartbeat but never for pain. No exacerbating or alleviating factors.   Past Medical History:  Diagnosis Date  . Headache   . MS (multiple sclerosis) (HCC)     There are no active problems to display for this patient.   Past Surgical History:  Procedure Laterality Date  . APPENDECTOMY    . CHOLECYSTECTOMY    . TONSILLECTOMY    . TUBAL LIGATION      Current Outpatient Rx  . Order #: [id number] Class: Historical Med  . Order #: [id number] Class: Historical Med  . Order #: [id number] Class: Print  . Order #: [id number] Class: Print    Allergies Doxycycline  Family History  Problem Relation Age of Onset  . Diabetes Other   . Hypertension Other     Social History Social History  Substance Use Topics  . Smoking status: Former Smoker    Packs/day: 0.00    Quit date: 08/20/2015  . Smokeless tobacco: Current User  . Alcohol use No    Review of Systems  Constitutional: No fever/chills Eyes: No visual changes. ENT: No sore throat. Positive URI symptoms and sinus pressure.  Cardiovascular: Positive chest pain and left arm pain.  Respiratory: Denies shortness of breath. Gastrointestinal: No abdominal pain.  No nausea, no vomiting.  No diarrhea.  No  constipation. Genitourinary: Negative for dysuria. Musculoskeletal: Negative for back pain. Skin: Negative for rash. Neurological: Negative for headaches, focal weakness or numbness.  10-point ROS otherwise negative.  ____________________________________________   PHYSICAL EXAM:  VITAL SIGNS: ED Triage Vitals  Enc Vitals Group     BP 01/15/16 1246 136/89     Pulse Rate 01/15/16 1246 64     Resp 01/15/16 1246 16     Temp 01/15/16 1246 98 F (36.7 C)     Temp Source 01/15/16 1246 Oral     SpO2 01/15/16 1246 100 %     Weight 01/15/16 1248 225 lb (102.1 kg)     Height 01/15/16 1248 5\' 1"  (1.549 m)     Pain Score 01/15/16 1254 4   Constitutional: Alert and oriented. Well appearing and in no acute distress. Eyes: Conjunctivae are normal.  Head: Atraumatic. Nose: No congestion/rhinnorhea. Mouth/Throat: Mucous membranes are moist.  Oropharynx non-erythematous. Neck: No stridor.  Cardiovascular: Normal rate, regular rhythm. Good peripheral circulation. Grossly normal heart sounds.   Respiratory: Normal respiratory effort.  No retractions. Lungs CTAB. Gastrointestinal: Soft and nontender. No distention.  Musculoskeletal: No lower extremity tenderness nor edema. No gross deformities of extremities. Neurologic:  Normal speech and language. No gross focal neurologic deficits are appreciated.  Skin:  Skin is warm, dry and intact. No rash noted.  ____________________________________________   LABS (all labs ordered are listed, but only abnormal results are displayed)  Labs Reviewed  CBC WITH DIFFERENTIAL/PLATELET - Abnormal; Notable for the following:       Result Value   Hemoglobin 11.4 (*)    HCT 34.8 (*)    All other components within normal limits  BASIC METABOLIC PANEL  TROPONIN I   ____________________________________________  EKG   EKG Interpretation  Date/Time:  Monday January 15 2016 12:57:42 EST Ventricular Rate:  54 PR Interval:  154 QRS Duration: 106 QT  Interval:  456 QTC Calculation: 432 R Axis:   7 Text Interpretation:  Sinus bradycardia with sinus arrhythmia Incomplete right bundle branch block Cannot rule out Anterior infarct , age undetermined Abnormal ECG No STEMI. Similar to prior.  Confirmed by Zari Cly MD, Deashia Soule [phone]) on 01/15/2016 3:04:47 PM       ____________________________________________  RADIOLOGY  Dg Chest 2 View  Result Date: 01/15/2016 CLINICAL DATA:  Left-sided chest pain for the past 3 days. History of hypertension, MS, discontinued cigarette smoking in June of 2017 EXAM: CHEST  2 VIEW COMPARISON:  PA and lateral chest x-ray dated December 20, 2015 FINDINGS: The lungs are well-expanded and clear. The heart and pulmonary vascularity are within the limits of normal. The mediastinum is normal in width. There is no pleural effusion. The bony thorax is unremarkable. IMPRESSION: There is no active cardiopulmonary disease. Electronically Signed   By: David  [place] M.D.   On: 01/15/2016 15:17    ____________________________________________   PROCEDURES  Procedure(s) performed:   Procedures  None ____________________________________________   INITIAL IMPRESSION / ASSESSMENT AND PLAN / ED COURSE  Pertinent labs & imaging results that were available during my care of the patient were reviewed by me and considered in my medical decision making (see chart for details).  Patient resents emergency pertinent for evaluation of intermittent chest and arm discomfort over the past 3 days. She is pain-free now with nonischemic EKG. Chest x-ray is not concerning. With several days of pain and low risk ACS patient plan for biomarker and baseline labs. Plan for referral as an outpatient if negative. Patient is low risk for PE by [place] with atypical symptoms. She is PERC negative.  04:24 PM Patient with normal labs including troponin. With several days of pain will not trend her biomarkers. Refer back to her primary care physician for  outpatient stress testing. Also provided contact information for local cardiologist. Discussed return precautions in detail. Currently chest pain-free.   At this time, I do not feel there is any life-threatening condition present. I have reviewed and discussed all results (EKG, imaging, lab, urine as appropriate), exam findings with patient. I have reviewed nursing notes and appropriate previous records.  I feel the patient is safe to be discharged home without further emergent workup. Discussed usual and customary return precautions. Patient and family (if present) verbalize understanding and are comfortable with this plan.  Patient will follow-up with their primary care provider. If they do not have a primary care provider, information for follow-up has been provided to them. All questions have been answered.  ____________________________________________  FINAL CLINICAL IMPRESSION(S) / ED DIAGNOSES  Final diagnoses:  Chest pain, unspecified type     MEDICATIONS GIVEN DURING THIS VISIT:  None  NEW OUTPATIENT MEDICATIONS STARTED DURING THIS VISIT:  None   Note:  This document was prepared using Dragon voice recognition software and may include unintentional dictation errors.  Alona Bene, MD Emergency Medicine   Maia Plan, MD 01/15/16 [phone]

## 2016-01-15 NOTE — ED Triage Notes (Signed)
Pain in her left arm. Pain started in her jaw and went down her arm 2 days ago. States she has a hx of heart problems and thought it could be a result of her heart. She also has a cold.

## 2016-03-04 ENCOUNTER — Emergency Department (HOSPITAL_COMMUNITY)
Admission: EM | Admit: 2016-03-04 | Discharge: 2016-03-04 | Disposition: A | Discharge status: Home / Self Care | Payer: 59 | Attending: Emergency Medicine | Admitting: Emergency Medicine

## 2016-03-04 ENCOUNTER — Emergency Department (HOSPITAL_COMMUNITY): Payer: 59

## 2016-03-04 ENCOUNTER — Encounter (HOSPITAL_COMMUNITY): Payer: Self-pay | Admitting: Emergency Medicine

## 2016-03-04 DIAGNOSIS — R109 Unspecified abdominal pain: Secondary | ICD-10-CM | POA: Insufficient documentation

## 2016-03-04 DIAGNOSIS — R55 Syncope and collapse: Secondary | ICD-10-CM | POA: Insufficient documentation

## 2016-03-04 DIAGNOSIS — Z87891 Personal history of nicotine dependence: Secondary | ICD-10-CM | POA: Diagnosis not present

## 2016-03-04 DIAGNOSIS — R110 Nausea: Secondary | ICD-10-CM | POA: Diagnosis not present

## 2016-03-04 HISTORY — DX: Chronic migraine without aura, not intractable, without status migrainosus: G43.709

## 2016-03-04 HISTORY — DX: Reserved for concepts with insufficient information to code with codable children: IMO0002

## 2016-03-04 HISTORY — DX: Depression, unspecified: F32.A

## 2016-03-04 HISTORY — DX: Major depressive disorder, single episode, unspecified: F32.9

## 2016-03-04 HISTORY — DX: Other visual disturbances: H53.8

## 2016-03-04 HISTORY — DX: Dizziness and giddiness: R42

## 2016-03-04 HISTORY — DX: Other amnesia: R41.3

## 2016-03-04 HISTORY — DX: Anesthesia of skin: R20.0

## 2016-03-04 LAB — BASIC METABOLIC PANEL
ANION GAP: 8 (ref 5–15)
BUN: 14 mg/dL (ref 6–20)
CHLORIDE: 107 mmol/L (ref 101–111)
CO2: 21 mmol/L — ABNORMAL LOW (ref 22–32)
CREATININE: 0.79 mg/dL (ref 0.44–1.00)
Calcium: 9.4 mg/dL (ref 8.9–10.3)
GFR calc non Af Amer: >60 mL/min (ref >60)
Glucose, Bld: 112 mg/dL — ABNORMAL HIGH (ref 65–99)
POTASSIUM: 4 mmol/L (ref 3.5–5.1)
SODIUM: 136 mmol/L (ref 135–145)

## 2016-03-04 LAB — HEPATIC FUNCTION PANEL
ALK PHOS: 51 U/L (ref 38–126)
ALT: 18 U/L (ref 14–54)
AST: 30 U/L (ref 15–41)
Albumin: 4.5 g/dL (ref 3.5–5.0)
BILIRUBIN DIRECT: 0.2 mg/dL (ref 0.1–0.5)
BILIRUBIN INDIRECT: 0.6 mg/dL (ref 0.3–0.9)
Total Bilirubin: 0.8 mg/dL (ref 0.3–1.2)
Total Protein: 7.7 g/dL (ref 6.5–8.1)

## 2016-03-04 LAB — D-DIMER, QUANTITATIVE (NOT AT ARMC)

## 2016-03-04 LAB — URINALYSIS, ROUTINE W REFLEX MICROSCOPIC
BILIRUBIN URINE: NEGATIVE
Bacteria, UA: NONE SEEN
GLUCOSE, UA: NEGATIVE mg/dL
Hgb urine dipstick: NEGATIVE
KETONES UR: NEGATIVE mg/dL
LEUKOCYTES UA: NEGATIVE
Nitrite: NEGATIVE
PH: 7 (ref 5.0–8.0)
Protein, ur: 30 mg/dL — AB
Specific Gravity, Urine: 1.019 (ref 1.005–1.030)

## 2016-03-04 LAB — CBC
HCT: 37.4 % (ref 36.0–46.0)
HEMOGLOBIN: 12.8 g/dL (ref 12.0–15.0)
MCH: 27.6 pg (ref 26.0–34.0)
MCHC: 34.2 g/dL (ref 30.0–36.0)
MCV: 80.6 fL (ref 78.0–100.0)
PLATELETS: 329 10*3/uL (ref 150–400)
RBC: 4.64 MIL/uL (ref 3.87–5.11)
RDW: 13.7 % (ref 11.5–15.5)
WBC: 10.8 10*3/uL — AB (ref 4.0–10.5)

## 2016-03-04 LAB — I-STAT BETA HCG BLOOD, ED (MC, WL, AP ONLY): I-stat hCG, quantitative: <5 m[IU]/mL (ref <5)

## 2016-03-04 LAB — CBG MONITORING, ED: Glucose-Capillary: 101 mg/dL — ABNORMAL HIGH (ref 65–99)

## 2016-03-04 LAB — LIPASE, BLOOD: Lipase: 20 U/L (ref 11–51)

## 2016-03-04 MED ORDER — ONDANSETRON HCL 4 MG PO TABS: 4.0000 mg | ORAL_TABLET | Freq: Three times a day (TID) | ORAL | 0 refills | Status: DC | PRN

## 2016-03-04 NOTE — Discharge Instructions (Signed)
Take the prescription as directed.  Increase your fluid intake (ie:  Gatoraide) for the next few days, as discussed.  Eat a bland diet and advance to your regular diet slowly as you can tolerate it. Call your regular medical doctor tomorrow to schedule a follow up appointment this week.  Return to the Emergency Department immediately if not improving (or even worsening) despite taking the medicines as prescribed, any black or bloody stool or vomit, if you develop a fever over "101," or for any other concerns.

## 2016-03-04 NOTE — ED Provider Notes (Signed)
[facility]-EMERGENCY DEPT Provider Note   CSN: [id number] Arrival date & time: 03/04/16  1135     History   Chief Complaint Chief Complaint  Patient presents with  . Dizziness  . Nausea    HPI Emily Howe is a 30 y.o. female.  HPI  Pt was seen at 1300. Per pt, c/o sudden onset and resolution of one episode of brief syncope that occurred PTA. Pt states she was "standing there," felt "lightheaded," followed by brief syncopal episode. Pt also c/o gradual onset and persistence of constant upper abd "pain" since yesterday. Has been associated with nausea. Denies vomiting/diarrhea, no abd pain, no CP/palpitations, no SOB/cough, no fevers, no back or neck pain, no black or blood in stools, no seizure activity, no AMS.     Past Medical History:  Diagnosis Date  . Blurry vision   . Chronic migraine   . Depression   . Memory loss   . MS (multiple sclerosis) (HCC)   . Numbness   . Vertigo     There are no active problems to display for this patient.   Past Surgical History:  Procedure Laterality Date  . APPENDECTOMY    . CHOLECYSTECTOMY    . TONSILLECTOMY    . TUBAL LIGATION      OB History    No data available       Home Medications    Prior to Admission medications   Medication Sig Start Date End Date Taking? Authorizing Provider  escitalopram (LEXAPRO) 20 MG tablet TK 1 T PO D 03/01/16  Yes Historical Provider, MD  ibuprofen (ADVIL,MOTRIN) 200 MG tablet Take 600 mg by mouth every 6 (six) hours as needed for headache.   Yes Historical Provider, MD  propranolol (INDERAL) 10 MG tablet Take 10 mg by mouth every evening.    Yes Historical Provider, MD  amoxicillin-clavulanate (AUGMENTIN) 875-125 MG tablet Take 1 tablet by mouth 2 (two) times daily. One po bid x 7 days Patient not taking: Reported on 03/04/2016 12/20/15   Marily Memos, MD  Peginterferon Beta-1a (PLEGRIDY [place]) Inject 160 mg into the skin.    Historical Provider, MD  potassium chloride SA (K-DUR,KLOR-CON) 20  MEQ tablet Take 2 tablets (40 mEq total) by mouth 2 (two) times daily. Patient not taking: Reported on 03/04/2016 10/01/15   Marily Memos, MD    Family History Family History  Problem Relation Age of Onset  . Diabetes Other   . Hypertension Other     Social History Social History  Substance Use Topics  . Smoking status: Former Smoker    Packs/day: 0.00    Quit date: 08/20/2015  . Smokeless tobacco: Current User  . Alcohol use No     Allergies   Doxycycline   Review of Systems Review of Systems ROS: Statement: All systems negative except as marked or noted in the HPI; Constitutional: Negative for fever and chills. ; ; Eyes: Negative for eye pain, redness and discharge. ; ; ENMT: Negative for ear pain, hoarseness, nasal congestion, sinus pressure and sore throat. ; ; Cardiovascular: Negative for chest pain, palpitations, diaphoresis, dyspnea and peripheral edema. ; ; Respiratory: Negative for cough, wheezing and stridor. ; ; Gastrointestinal: +abd pain, nausea. Negative for vomiting, diarrhea, blood in stool, hematemesis, jaundice and rectal bleeding. . ; ; Genitourinary: Negative for dysuria, flank pain and hematuria. ; ; Musculoskeletal: Negative for back pain and neck pain. Negative for swelling and trauma.; ; Skin: Negative for pruritus, rash, abrasions, blisters, bruising and skin lesion.; ;  Neuro: Negative for headache, lightheadedness and neck stiffness. Negative for weakness, extremity weakness, paresthesias, involuntary movement, seizure and +syncope.      Physical Exam Updated Vital Signs BP 130/87   Pulse (!) 57   Temp 97.6 F (36.4 C) (Oral)   Resp 18   Ht 5\' 3"  (1.6 m)   Wt 220 lb (99.8 kg)   LMP 03/02/2016   SpO2 100%   BMI 38.97 kg/m   13:18:36 Orthostatic Vital Signs SL  Orthostatic Lying   BP- Lying: 129/75  Pulse- Lying: 54      Orthostatic Sitting  BP- Sitting: 129/79  Pulse- Sitting: 61      Orthostatic Standing at 0 minutes  BP- Standing at 0  minutes: 130/87  Pulse- Standing at 0 minutes: 84     Physical Exam 1305: Physical examination:  Nursing notes reviewed; Vital signs and O2 SAT reviewed;  Constitutional: Well developed, Well nourished, Well hydrated, In no acute distress; Head:  Normocephalic, atraumatic; Eyes: EOMI, PERRL, No scleral icterus; ENMT: Mouth and pharynx normal, Mucous membranes moist; Neck: Supple, Full range of motion, No lymphadenopathy; Cardiovascular: Regular rate and rhythm, No gallop; Respiratory: Breath sounds clear & equal bilaterally, No wheezes.  Speaking full sentences with ease, Normal respiratory effort/excursion; Chest: Nontender, Movement normal; Abdomen: Soft, Nontender, Nondistended, Normal bowel sounds; Genitourinary: No CVA tenderness; Extremities: Pulses normal, No tenderness, No edema, No calf edema or asymmetry.; Neuro: AA&Ox3, Major CN grossly intact. No facial droop. Speech clear. No gross focal motor or sensory deficits in extremities.; Skin: Color normal, Warm, Dry.   ED Treatments / Results  Labs (all labs ordered are listed, but only abnormal results are displayed)   EKG  EKG Interpretation  Date/Time:  Monday March 04 2016 11:45:18 EST Ventricular Rate:  81 PR Interval:    QRS Duration: 109 QT Interval:  380 QTC Calculation: 442 R Axis:   -3 Text Interpretation:  Sinus rhythm Incomplete RBBB and LAFB Low voltage, precordial leads RSR' in V1 or V2, right VCD or RVH Baseline wander When compared with ECG of 01/15/2016 No significant change was found Confirmed by [facility]  MD, Nicholos Johns [phone]) on 03/04/2016 2:14:07 PM       Radiology   Procedures Procedures (including critical care time)  Medications Ordered in ED Medications - No data to display   Initial Impression / Assessment and Plan / ED Course  I have reviewed the triage vital signs and the nursing notes.  Pertinent labs & imaging results that were available during my care of the patient were reviewed by me  and considered in my medical decision making (see chart for details).  MDM Reviewed: previous chart, nursing note and vitals Reviewed previous: labs and ECG Interpretation: ECG, labs, x-ray and CT scan   Results for orders placed or performed during the hospital encounter of 03/04/16  Basic metabolic panel  Result Value Ref Range   Sodium 136 135 - 145 mmol/L   Potassium 4.0 3.5 - 5.1 mmol/L   Chloride 107 101 - 111 mmol/L   CO2 21 (L) 22 - 32 mmol/L   Glucose, Bld 112 (H) 65 - 99 mg/dL   BUN 14 6 - 20 mg/dL   Creatinine, Ser 6.04 0.44 - 1.00 mg/dL   Calcium 9.4 8.9 - 54.0 mg/dL   GFR calc non Af Amer >60 >60 mL/min   GFR calc Af Amer >60 >60 mL/min   Anion gap 8 5 - 15  CBC  Result Value Ref Range  WBC 10.8 (H) 4.0 - 10.5 K/uL   RBC 4.64 3.87 - 5.11 MIL/uL   Hemoglobin 12.8 12.0 - 15.0 g/dL   HCT 40.937.4 81.136.0 - 91.446.0 %   MCV 80.6 78.0 - 100.0 fL   MCH 27.6 26.0 - 34.0 pg   MCHC 34.2 30.0 - 36.0 g/dL   RDW 78.213.7 95.611.5 - 21.315.5 %   Platelets 329 150 - 400 K/uL  Urinalysis, Routine w reflex microscopic  Result Value Ref Range   Color, Urine YELLOW YELLOW   APPearance HAZY (A) CLEAR   Specific Gravity, Urine 1.019 1.005 - 1.030   pH 7.0 5.0 - 8.0   Glucose, UA NEGATIVE NEGATIVE mg/dL   Hgb urine dipstick NEGATIVE NEGATIVE   Bilirubin Urine NEGATIVE NEGATIVE   Ketones, ur NEGATIVE NEGATIVE mg/dL   Protein, ur 30 (A) NEGATIVE mg/dL   Nitrite NEGATIVE NEGATIVE   Leukocytes, UA NEGATIVE NEGATIVE   RBC / HPF 0-5 0 - 5 RBC/hpf   WBC, UA 0-5 0 - 5 WBC/hpf   Bacteria, UA NONE SEEN NONE SEEN   Squamous Epithelial / LPF 6-30 (A) NONE SEEN   Mucous PRESENT   Hepatic function panel  Result Value Ref Range   Total Protein 7.7 6.5 - 8.1 g/dL   Albumin 4.5 3.5 - 5.0 g/dL   AST 30 15 - 41 U/L   ALT 18 14 - 54 U/L   Alkaline Phosphatase 51 38 - 126 U/L   Total Bilirubin 0.8 0.3 - 1.2 mg/dL   Bilirubin, Direct 0.2 0.1 - 0.5 mg/dL   Indirect Bilirubin 0.6 0.3 - 0.9 mg/dL  Lipase,  blood  Result Value Ref Range   Lipase 20 11 - 51 U/L  D-dimer, quantitative  Result Value Ref Range   D-Dimer, Quant <0.27 0.00 - 0.50 ug/mL-FEU  CBG monitoring, ED  Result Value Ref Range   Glucose-Capillary 101 (H) 65 - 99 mg/dL  I-Stat beta hCG blood, ED  Result Value Ref Range   I-stat hCG, quantitative <5.0 <5 mIU/mL   Comment 3           Ct Head Wo Contrast Result Date: 03/04/2016 CLINICAL DATA:  Dizziness and nausea beginning yesterday morning, syncope this morning, history multiple sclerosis, former smoker EXAM: CT HEAD WITHOUT CONTRAST TECHNIQUE: Contiguous axial images were obtained from the base of the skull through the vertex without intravenous contrast. COMPARISON:  09/15/2014; correlation interval MR brain 03/11/2015 FINDINGS: Brain: Insert normal squash Vascular: Unremarkable Skull: Intact Sinuses/Orbits: Clear Other: N/A IMPRESSION: Normal exam. Electronically Signed   By: Ulyses SouthwardMark  Boles M.D.   On: 03/04/2016 13:48   Dg Abd Acute W/chest Result Date: 03/04/2016 CLINICAL DATA:  Central abdominal pain and nausea since yesterday. Shortness of breath. EXAM: DG ABDOMEN ACUTE W/ 1V CHEST COMPARISON:  CT abdomen and pelvis 10/03/2012. PA and lateral chest 01/15/2016. FINDINGS: Single-view of the chest demonstrates clear lungs and normal heart size. No pneumothorax or pleural effusion. Two views of the abdomen show no free intraperitoneal air. The bowel gas pattern is normal. No unexpected abdominal calcification is seen. Cholecystectomy clips are noted. Tubal ligation clips are also seen. IMPRESSION: Negative exam. Electronically Signed   By: Drusilla Kannerhomas  Dalessio M.D.   On: 03/04/2016 13:44    1710:   Not orthostatic on VS. Has tol PO well without N/V. Workup otherwise reassuring. Dx and testing d/w pt and family.  Questions answered.  Verb understanding, agreeable to d/c home with outpt f/u.    Final Clinical Impressions(s) / ED  Diagnoses   Final diagnoses:  None    New  Prescriptions New Prescriptions   No medications on file     Samuel Jester, DO 03/08/16 1813

## 2016-03-04 NOTE — ED Triage Notes (Signed)
Pt complaint of dizziness and nausea onset yesterday morning. Pt continues to report "passing out" this morning. Pt a/o x4 and ambulatory to triage.

## 2016-03-04 NOTE — ED Notes (Signed)
Patient transported to X-ray 

## 2016-03-04 NOTE — ED Notes (Signed)
Patient given sprite.

## 2016-03-04 NOTE — ED Notes (Signed)
Patient tolerating PO fluids 

## 2016-12-28 ENCOUNTER — Encounter (HOSPITAL_BASED_OUTPATIENT_CLINIC_OR_DEPARTMENT_OTHER): Payer: Self-pay | Admitting: *Deleted

## 2016-12-28 ENCOUNTER — Emergency Department (HOSPITAL_BASED_OUTPATIENT_CLINIC_OR_DEPARTMENT_OTHER)
Admission: EM | Admit: 2016-12-28 | Discharge: 2016-12-28 | Disposition: A | Discharge status: Home / Self Care | Payer: 59 | Attending: Emergency Medicine | Admitting: Emergency Medicine

## 2016-12-28 ENCOUNTER — Emergency Department (HOSPITAL_BASED_OUTPATIENT_CLINIC_OR_DEPARTMENT_OTHER): Payer: 59

## 2016-12-28 DIAGNOSIS — R202 Paresthesia of skin: Secondary | ICD-10-CM | POA: Diagnosis not present

## 2016-12-28 DIAGNOSIS — H538 Other visual disturbances: Secondary | ICD-10-CM | POA: Diagnosis not present

## 2016-12-28 DIAGNOSIS — G35 Multiple sclerosis: Secondary | ICD-10-CM | POA: Insufficient documentation

## 2016-12-28 DIAGNOSIS — Z87891 Personal history of nicotine dependence: Secondary | ICD-10-CM | POA: Insufficient documentation

## 2016-12-28 DIAGNOSIS — Z79899 Other long term (current) drug therapy: Secondary | ICD-10-CM | POA: Insufficient documentation

## 2016-12-28 DIAGNOSIS — R079 Chest pain, unspecified: Secondary | ICD-10-CM | POA: Insufficient documentation

## 2016-12-28 DIAGNOSIS — R51 Headache: Secondary | ICD-10-CM | POA: Diagnosis present

## 2016-12-28 LAB — BASIC METABOLIC PANEL
ANION GAP: 8 (ref 5–15)
BUN: 15 mg/dL (ref 6–20)
CHLORIDE: 108 mmol/L (ref 101–111)
CO2: 22 mmol/L (ref 22–32)
CREATININE: 0.78 mg/dL (ref 0.44–1.00)
Calcium: 9.1 mg/dL (ref 8.9–10.3)
GFR calc non Af Amer: >60 mL/min (ref >60)
Glucose, Bld: 94 mg/dL (ref 65–99)
Potassium: 3.5 mmol/L (ref 3.5–5.1)
SODIUM: 138 mmol/L (ref 135–145)

## 2016-12-28 LAB — CBC WITH DIFFERENTIAL/PLATELET
BASOS ABS: 0.1 10*3/uL (ref 0.0–0.1)
BASOS PCT: 1 %
EOS ABS: 0.1 10*3/uL (ref 0.0–0.7)
Eosinophils Relative: 2 %
HCT: 34.3 % — ABNORMAL LOW (ref 36.0–46.0)
Hemoglobin: 11.9 g/dL — ABNORMAL LOW (ref 12.0–15.0)
Lymphocytes Relative: 19 %
Lymphs Abs: 1.4 10*3/uL (ref 0.7–4.0)
MCH: 29.2 pg (ref 26.0–34.0)
MCHC: 34.7 g/dL (ref 30.0–36.0)
MCV: 84.1 fL (ref 78.0–100.0)
Monocytes Absolute: 0.6 10*3/uL (ref 0.1–1.0)
Monocytes Relative: 8 %
NEUTROS ABS: 5 10*3/uL (ref 1.7–7.7)
NEUTROS PCT: 70 %
Platelets: 233 10*3/uL (ref 150–400)
RBC: 4.08 MIL/uL (ref 3.87–5.11)
RDW: 12.5 % (ref 11.5–15.5)
WBC: 7.1 10*3/uL (ref 4.0–10.5)

## 2016-12-28 LAB — TROPONIN I

## 2016-12-28 MED ORDER — ONDANSETRON HCL 4 MG/2ML IJ SOLN
4.0000 mg | Freq: Once | INTRAMUSCULAR | Status: AC
  Administered 2016-12-28: 4 mg via INTRAVENOUS
  Filled 2016-12-28: qty 2

## 2016-12-28 MED ORDER — SODIUM CHLORIDE 0.9 % IV SOLN
1000.0000 mg | Freq: Once | INTRAVENOUS | Status: AC
  Administered 2016-12-28: 1000 mg via INTRAVENOUS
  Filled 2016-12-28: qty 8

## 2016-12-28 MED ORDER — MORPHINE SULFATE (PF) 4 MG/ML IV SOLN
4.0000 mg | Freq: Once | INTRAVENOUS | Status: AC
  Administered 2016-12-28: 4 mg via INTRAVENOUS
  Filled 2016-12-28: qty 1

## 2016-12-28 MED ORDER — OMEPRAZOLE 20 MG PO CPDR: 20.0000 mg | DELAYED_RELEASE_CAPSULE | Freq: Two times a day (BID) | ORAL | 1 refills | Status: AC

## 2016-12-28 MED ORDER — PANTOPRAZOLE SODIUM 40 MG IV SOLR
40.0000 mg | Freq: Once | INTRAVENOUS | Status: AC
  Administered 2016-12-28: 40 mg via INTRAVENOUS
  Filled 2016-12-28: qty 40

## 2016-12-28 MED ORDER — TRAZODONE HCL 50 MG PO TABS: 50.0000 mg | ORAL_TABLET | Freq: Every day | ORAL | 0 refills | Status: AC

## 2016-12-28 MED ORDER — METHYLPREDNISOLONE SODIUM SUCC 125 MG IJ SOLR
INTRAMUSCULAR | Status: AC
  Filled 2016-12-28: qty 16

## 2016-12-28 NOTE — Discharge Instructions (Signed)
Follow-up with your neurologist Monday morning for further instructions regarding possible continued daily infusions of steroid for your multiple sclerosis exacerbation.

## 2016-12-28 NOTE — ED Notes (Signed)
EDP at bedside  

## 2016-12-28 NOTE — ED Notes (Signed)
Family at bedside. 

## 2016-12-28 NOTE — ED Provider Notes (Signed)
[facility] EMERGENCY DEPARTMENT Provider Note   CSN: [id number] Arrival date & time: 12/28/16  1728     History   Chief Complaint Chief Complaint  Patient presents with  . Chest Pain    HPI Emily Howe is a 31 y.o. female. Chief complaint is headache, vision changes, left arm tingling  HPI:  31 year old female. History of MS. Followed by neuro at [facility]. Receives yearly infusions. Infrequent exacerbations. Has had 2 previous exacerbations similar to today. She describes over the last several hours diffuse blurry vision, tingling in her left arm, and mild headache.  No thunderclap-type event. No rece fevers. No nvomiting or photosensitivity. No difficulty with swallowing or speech. No right upper lower symptoms. Normal gait.  Past Medical History:  Diagnosis Date  . Blurry vision   . Chronic migraine   . Depression   . Memory loss   . MS (multiple sclerosis) (HCC)   . Numbness   . Vertigo     There are no active problems to display for this patient.   Past Surgical History:  Procedure Laterality Date  . APPENDECTOMY    . CHOLECYSTECTOMY    . TONSILLECTOMY    . TUBAL LIGATION      OB History    No data available       Home Medications    Prior to Admission medications   Medication Sig Start Date End Date Taking? Authorizing Provider  acyclovir (ZOVIRAX) 200 MG capsule Take 200 mg by mouth 5 (five) times daily.   Yes [provider]  ibuprofen (ADVIL,MOTRIN) 200 MG tablet Take 600 mg by mouth every 6 (six) hours as needed for headache.    [provider]  omeprazole (PRILOSEC) 20 MG capsule Take 1 capsule (20 mg total) by mouth 2 (two) times daily. 12/28/16   Rolland Porter, MD  Peginterferon Beta-1a (PLEGRIDY Taliaferro) Inject 160 mg into the skin.    [provider]  propranolol (INDERAL) 10 MG tablet Take 10 mg by mouth every evening.     [provider]  traZODone (DESYREL) 50 MG tablet Take 1 tablet (50 mg  total) by mouth at bedtime. 12/28/16   Rolland Porter, MD    Family History Family History  Problem Relation Age of Onset  . Diabetes Other   . Hypertension Other     Social History Social History  Substance Use Topics  . Smoking status: Former Smoker    Packs/day: 0.00    Quit date: 08/20/2015  . Smokeless tobacco: Current User  . Alcohol use No     Allergies   Doxycycline   Review of Systems Review of Systems  Constitutional: Negative for appetite change, chills, diaphoresis, fatigue and fever.  HENT: Negative for mouth sores, sore throat and trouble swallowing.   Eyes: Positive for visual disturbance.  Respiratory: Negative for cough, chest tightness, shortness of breath and wheezing.   Cardiovascular: Negative for chest pain.  Gastrointestinal: Negative for abdominal distention, abdominal pain, diarrhea, nausea and vomiting.  Endocrine: Negative for polydipsia, polyphagia and polyuria.  Genitourinary: Negative for dysuria, frequency and hematuria.  Musculoskeletal: Negative for gait problem.  Skin: Negative for color change, pallor and rash.  Neurological: Positive for dizziness, weakness and headaches. Negative for syncope and light-headedness.  Hematological: Does not bruise/bleed easily.  Psychiatric/Behavioral: Negative for behavioral problems and confusion.     Physical Exam Updated Vital Signs BP 123/73 (BP Location: Right Arm)   Pulse 62   Temp 98.2 F (36.8 C) (Oral)  Resp 18   Ht 5\' 3"  (1.6 m)   Wt 95.7 kg (211 lb)   LMP 12/28/2016   SpO2 100%   BMI 37.38 kg/m   Physical Exam  Constitutional: She is oriented to person, place, and time. She appears well-developed and well-nourished. No distress.  HENT:  Head: Normocephalic.  Eyes: Pupils are equal, round, and reactive to light. Conjunctivae are normal. No scleral icterus.  Neck: Normal range of motion. Neck supple. No thyromegaly present.  Cardiovascular: Normal rate and regular rhythm.  Exam  reveals no gallop and no friction rub.   No murmur heard. Pulmonary/Chest: Effort normal and breath sounds normal. No respiratory distress. She has no wheezes. She has no rales.  Abdominal: Soft. Bowel sounds are normal. She exhibits no distension. There is no tenderness. There is no rebound.  Musculoskeletal: Normal range of motion.  Neurological: She is alert and oriented to person, place, and time.  Normal symmetric cranial nerves. No pronator drift or leg drift. No deficits in strength, sensation, or cerebellar function.  Skin: Skin is warm and dry. No rash noted.  Psychiatric: She has a normal mood and affect. Her behavior is normal.     ED Treatments / Results  Labs (all labs ordered are listed, but only abnormal results are displayed) Labs Reviewed  CBC WITH DIFFERENTIAL/PLATELET - Abnormal; Notable for the following:       Result Value   Hemoglobin 11.9 (*)    HCT 34.3 (*)    All other components within normal limits  BASIC METABOLIC PANEL  TROPONIN I    EKG  EKG Interpretation None       Radiology Ct Head Wo Contrast  Result Date: 12/28/2016 CLINICAL DATA:  Acute onset of headache 1 hour prior to arrival. Dizziness and blurred vision. EXAM: CT HEAD WITHOUT CONTRAST TECHNIQUE: Contiguous axial images were obtained from the base of the skull through the vertex without intravenous contrast. COMPARISON:  03/04/2016 FINDINGS: Brain: No evidence of acute infarction, hemorrhage, hydrocephalus, extra-axial collection or mass lesion/mass effect. Vascular: No hyperdense vessel or unexpected calcification. Skull: Normal. Negative for fracture or focal lesion. Sinuses/Orbits: No acute finding. Other: None IMPRESSION: No acute intracranial abnormality. Electronically Signed   By: Tollie Eth M.D.   On: 12/28/2016 18:49    Procedures Procedures (including critical care time)  Medications Ordered in ED Medications  methylPREDNISolone sodium succinate (SOLU-MEDROL) 125 mg/2 mL  injection (not administered)  pantoprazole (PROTONIX) injection 40 mg (40 mg Intravenous Given 12/28/16 1911)  methylPREDNISolone sodium succinate (SOLU-MEDROL) 1,000 mg in sodium chloride 0.9 % 50 mL IVPB (0 mg Intravenous Stopped 12/28/16 2104)  morphine 4 MG/ML injection 4 mg (4 mg Intravenous Given 12/28/16 2110)  ondansetron (ZOFRAN) injection 4 mg (4 mg Intravenous Given 12/28/16 2108)     Initial Impression / Assessment and Plan / ED Course  I have reviewed the triage vital signs and the nursing notes.  Pertinent labs & imaging results that were available during my care of the patient were reviewed by me and considered in my medical decision making (see chart for details).    No risks for acute CVA. Normal CT. She's responded well to high-dose steroid infusions. Given 1 g IV Solu-Medrol. Discharge of trazodone, and Prilosec. Allergy follow-up in just a little over 24 hours on Monday morning to discuss ongoing steroid treatment with her neurologist.  Final Clinical Impressions(s) / ED Diagnoses   Final diagnoses:  Multiple sclerosis [facility])    New Prescriptions Discharge Medication List as  of 12/28/2016  9:35 PM       Rolland PorterJames, Mandeep Kiser, MD 12/28/16 2318

## 2016-12-28 NOTE — ED Triage Notes (Addendum)
Pt reports sudden onset CP, HA, left arm pain and bilateral blurred vision x 1 hour PTA. Reports SOB-speaking in complete sentences, nonlabored breathing, no tachypnea. BS clear bilaterally.   Pt began to have symptoms at 4pm. States that she was laying down when it started. Hx of same (x3)-when dx with MS in Feb 2017.

## 2016-12-28 NOTE — ED Notes (Signed)
Patient transported to CT 

## 2017-05-12 ENCOUNTER — Emergency Department (HOSPITAL_BASED_OUTPATIENT_CLINIC_OR_DEPARTMENT_OTHER): Payer: 59

## 2017-05-12 ENCOUNTER — Encounter (HOSPITAL_BASED_OUTPATIENT_CLINIC_OR_DEPARTMENT_OTHER): Payer: Self-pay | Admitting: Emergency Medicine

## 2017-05-12 ENCOUNTER — Other Ambulatory Visit: Payer: Self-pay

## 2017-05-12 ENCOUNTER — Emergency Department (HOSPITAL_BASED_OUTPATIENT_CLINIC_OR_DEPARTMENT_OTHER)
Admission: EM | Admit: 2017-05-12 | Discharge: 2017-05-12 | Disposition: A | Discharge status: Home / Self Care | Payer: 59 | Attending: Emergency Medicine | Admitting: Emergency Medicine

## 2017-05-12 DIAGNOSIS — F17290 Nicotine dependence, other tobacco product, uncomplicated: Secondary | ICD-10-CM | POA: Insufficient documentation

## 2017-05-12 DIAGNOSIS — M79602 Pain in left arm: Secondary | ICD-10-CM | POA: Insufficient documentation

## 2017-05-12 DIAGNOSIS — R072 Precordial pain: Secondary | ICD-10-CM | POA: Insufficient documentation

## 2017-05-12 LAB — CBC WITH DIFFERENTIAL/PLATELET
BASOS ABS: 0 10*3/uL (ref 0.0–0.1)
BASOS PCT: 0 %
EOS ABS: 0 10*3/uL (ref 0.0–0.7)
Eosinophils Relative: 1 %
HEMATOCRIT: 32 % — AB (ref 36.0–46.0)
HEMOGLOBIN: 10.8 g/dL — AB (ref 12.0–15.0)
Lymphocytes Relative: 1 %
Lymphs Abs: 0.1 10*3/uL — ABNORMAL LOW (ref 0.7–4.0)
MCH: 29 pg (ref 26.0–34.0)
MCHC: 33.8 g/dL (ref 30.0–36.0)
MCV: 86 fL (ref 78.0–100.0)
MONOS PCT: 7 %
Monocytes Absolute: 0.3 10*3/uL (ref 0.1–1.0)
Neutro Abs: 4.4 10*3/uL (ref 1.7–7.7)
Neutrophils Relative %: 91 %
Platelets: 206 10*3/uL (ref 150–400)
RBC: 3.72 MIL/uL — ABNORMAL LOW (ref 3.87–5.11)
RDW: 12.1 % (ref 11.5–15.5)
WBC: 4.9 10*3/uL (ref 4.0–10.5)

## 2017-05-12 LAB — BASIC METABOLIC PANEL
ANION GAP: 5 (ref 5–15)
BUN: 13 mg/dL (ref 6–20)
CALCIUM: 8.8 mg/dL — AB (ref 8.9–10.3)
CO2: 27 mmol/L (ref 22–32)
CREATININE: 0.62 mg/dL (ref 0.44–1.00)
Chloride: 106 mmol/L (ref 101–111)
GFR calc Af Amer: >60 mL/min (ref >60)
Glucose, Bld: 88 mg/dL (ref 65–99)
Potassium: 3.8 mmol/L (ref 3.5–5.1)
SODIUM: 138 mmol/L (ref 135–145)

## 2017-05-12 LAB — D-DIMER, QUANTITATIVE (NOT AT ARMC): D DIMER QUANT: 0.44 ug{FEU}/mL (ref 0.00–0.50)

## 2017-05-12 LAB — TROPONIN I

## 2017-05-12 MED ORDER — IBUPROFEN 800 MG PO TABS: 800.0000 mg | ORAL_TABLET | Freq: Three times a day (TID) | ORAL | 0 refills | Status: DC | PRN

## 2017-05-12 MED ORDER — TRAMADOL HCL 50 MG PO TABS: 50.0000 mg | ORAL_TABLET | Freq: Four times a day (QID) | ORAL | 0 refills | Status: AC | PRN

## 2017-05-12 MED FILL — IBUPROFEN 800 MG TABLET: 800 | 7 days supply | Qty: 21 | Fill #0

## 2017-05-12 MED FILL — traMADol HCL 50 MG TABS: 50 | 3 days supply | Qty: 15 | Fill #0

## 2017-05-12 NOTE — ED Triage Notes (Addendum)
Patient recently outpatient for infusion with IV.  Reports left arm pain since having IV removed last Wednesday.  IV was in place from last Monday until Wednesday.  Reports "veins feel hard", arm feels warm.

## 2017-05-12 NOTE — Discharge Instructions (Signed)

## 2017-05-12 NOTE — ED Notes (Signed)
Pt verbalized understanding of discharge instructions and denies any further questions at this time.   

## 2017-05-12 NOTE — ED Provider Notes (Signed)
Emergency Department Provider Note   I have reviewed the triage vital signs and the nursing notes.   HISTORY  Chief Complaint Arm Pain   HPI Emily Howe is a 32 y.o. female with PMH of MS recently received an infusion of [place] as an outpatient presents to the emergency department for evaluation of pain in the left arm radiating to the shoulder, neck, left chest.  The patient completed her course of the infusion.  They did have to stop several times to administer steroids the patient began to develop fever during the infusion.  Dates that the IV remained patent throughout the entire infusion and they did not have to repeat or replace the IV.  Since the infusion she has had pain in the entire left arm worse near the elbow.  She is also having chest discomfort in the left chest.  Describes it is slightly pleuritic.  Denies shortness of breath or exertional chest pain.  She continues to have intermittent fever and has had some weakness in the left upper extremity along with localized numbness of the arm near the elbow.    Past Medical History:  Diagnosis Date  . Blurry vision   . Chronic migraine   . Depression   . Memory loss   . MS (multiple sclerosis) (HCC)   . Numbness   . Vertigo     There are no active problems to display for this patient.   Past Surgical History:  Procedure Laterality Date  . APPENDECTOMY    . CHOLECYSTECTOMY    . TONSILLECTOMY    . TUBAL LIGATION      Current Outpatient Rx  . Order #: [id number] Class: Historical Med  . Order #: [id number] Class: Print  . Order #: [id number] Class: Print  . Order #: [id number] Class: Historical Med  . Order #: [id number] Class: Historical Med  . Order #: [id number] Class: Print  . Order #: [id number] Class: Print    Allergies Doxycycline  Family History  Problem Relation Age of Onset  . Diabetes Other   . Hypertension Other     Social History Social History   Tobacco Use  . Smoking status: Former Smoker   Packs/day: 0.00    Last attempt to quit: 08/20/2015    Years since quitting: 1.7  . Smokeless tobacco: Current User  Substance Use Topics  . Alcohol use: No  . Drug use: No    Review of Systems  Constitutional: Positive fever/chills Eyes: No visual changes. ENT: No sore throat. Cardiovascular: Positive chest pain. Respiratory: Negative shortness of breath. Gastrointestinal: No abdominal pain.  No nausea, no vomiting.  No diarrhea.  No constipation. Genitourinary: Negative for dysuria. Musculoskeletal: Negative for back pain. Left arm pain.  Skin: Negative for rash. Neurological: Negative for headaches, focal weakness or numbness.  10-point ROS otherwise negative.  ____________________________________________   PHYSICAL EXAM:  VITAL SIGNS: ED Triage Vitals  Enc Vitals Group     BP 05/12/17 1043 111/78     Pulse Rate 05/12/17 1043 75     Resp 05/12/17 1043 18     Temp 05/12/17 1043 98.9 F (37.2 C)     Temp Source 05/12/17 1043 Oral     SpO2 05/12/17 1043 98 %     Weight 05/12/17 1043 211 lb (95.7 kg)     Height 05/12/17 1043 5\' 1"  (1.549 m)     Pain Score 05/12/17 1050 6   Constitutional: Alert and oriented. Well appearing and in no acute distress. Eyes: Conjunctivae are normal. Head: Atraumatic. Nose:  No congestion/rhinnorhea. Mouth/Throat: Mucous membranes are moist.  Neck: No stridor. Cardiovascular: Normal rate, regular rhythm. Good peripheral circulation. Grossly normal heart sounds.   Respiratory: Normal respiratory effort.  No retractions. Lungs CTAB. Gastrointestinal: Soft and nontender. No distention.  Musculoskeletal: No lower extremity tenderness nor edema. No gross deformities of extremities. No pain or swelling near the IV insertion site. No evidence of abscess or cellulitis in the arm. The left and right arms are similar in size and color. Normal pulses in the extremities.  Neurologic:  Normal speech and language. 4+/5 grip strength in the LUE with  otherwise normal exam of the upper and lower extremities.  Skin:  Skin is warm, dry and intact. No rash noted.  ____________________________________________   LABS (all labs ordered are listed, but only abnormal results are displayed)  Labs Reviewed  BASIC METABOLIC PANEL - Abnormal; Notable for the following components:      Result Value   Calcium 8.8 (*)    All other components within normal limits  CBC WITH DIFFERENTIAL/PLATELET - Abnormal; Notable for the following components:   RBC 3.72 (*)    Hemoglobin 10.8 (*)    HCT 32.0 (*)    Lymphs Abs 0.1 (*)    All other components within normal limits  D-DIMER, QUANTITATIVE (NOT AT [facility])  TROPONIN I   ____________________________________________  EKG   EKG Interpretation  Date/Time:  Monday May 12 2017 11:41:42 EST Ventricular Rate:  60 PR Interval:    QRS Duration: 121 QT Interval:  409 QTC Calculation: 409 R Axis:   4 Text Interpretation:  Age not entered, assumed to be  32 years old for purpose of ECG interpretation Sinus rhythm Nonspecific intraventricular conduction delay No STEMI.  Similar to prior.  Confirmed by Alona Bene [phone]) on 05/12/2017 11:49:00 AM       ____________________________________________  RADIOLOGY  Dg Chest 2 View  Result Date: 05/12/2017 CLINICAL DATA:  Chest pain for 3 days. EXAM: CHEST  2 VIEW COMPARISON:  03/04/2016 FINDINGS: The heart size and mediastinal contours are within normal limits. Both lungs are clear. No pleural effusion or pneumothorax. The visualized skeletal structures are unremarkable. IMPRESSION: No active cardiopulmonary disease. Electronically Signed   By: Amie Portland M.D.   On: 05/12/2017 11:34    ____________________________________________   PROCEDURES  Procedure(s) performed:   Procedures  None ____________________________________________   INITIAL IMPRESSION / ASSESSMENT AND PLAN / ED COURSE  Pertinent labs & imaging results that were available  during my care of the patient were reviewed by me and considered in my medical decision making (see chart for details).  Patient presents to the emergency department for evaluation of left arm pain that began shortly after an infusion.  The arm shows no evidence of infiltration, tissue necrosis, cellulitis, underlying abscess.  The left and right arm are of similar size and color.  My suspicion for upper extremity DVT is very low.  The infusion last approximate 4 hours and was performed through a peripheral IV.  Patient does have some pain radiating to the left chest which she describes as slightly pleuritic.  Given her chest discomfort I plan for labs, chest x-ray, d-dimer for further evaluation.   Labs, Imaging, and EKG reviewed. No indication for further chest pain testing at this time. Plan for MSK treatment and f/u by phone today with Neurology regarding potential side effect of the infusion therapy.   At this time, I do not feel there is any life-threatening condition present. I have reviewed  and discussed all results (EKG, imaging, lab, urine as appropriate), exam findings with patient. I have reviewed nursing notes and appropriate previous records.  I feel the patient is safe to be discharged home without further emergent workup. Discussed usual and customary return precautions. Patient and family (if present) verbalize understanding and are comfortable with this plan.  Patient will follow-up with their primary care provider. If they do not have a primary care provider, information for follow-up has been provided to them. All questions have been answered.  ____________________________________________  FINAL CLINICAL IMPRESSION(S) / ED DIAGNOSES  Final diagnoses:  Left arm pain  Precordial chest pain     NEW OUTPATIENT MEDICATIONS STARTED DURING THIS VISIT:  Discharge Medication List as of 05/12/2017  1:08 PM    START taking these medications   Details  traMADol (ULTRAM) 50 MG tablet  Take 1 tablet (50 mg total) by mouth every 6 (six) hours as needed., Starting Mon 05/12/2017, Print        Note:  This document was prepared using Dragon voice recognition software and may include unintentional dictation errors.  Alona Bene, MD Emergency Medicine    Leverett Camplin, Arlyss Repress, MD 05/12/17 Norberta Keens

## 2017-12-02 ENCOUNTER — Emergency Department (HOSPITAL_BASED_OUTPATIENT_CLINIC_OR_DEPARTMENT_OTHER)
Admission: EM | Admit: 2017-12-02 | Discharge: 2017-12-02 | Disposition: A | Discharge status: Home / Self Care | Payer: 59 | Attending: Emergency Medicine | Admitting: Emergency Medicine

## 2017-12-02 ENCOUNTER — Encounter (HOSPITAL_BASED_OUTPATIENT_CLINIC_OR_DEPARTMENT_OTHER): Payer: Self-pay | Admitting: *Deleted

## 2017-12-02 ENCOUNTER — Other Ambulatory Visit: Payer: Self-pay

## 2017-12-02 DIAGNOSIS — F410 Panic disorder [episodic paroxysmal anxiety] without agoraphobia: Secondary | ICD-10-CM | POA: Diagnosis not present

## 2017-12-02 DIAGNOSIS — E876 Hypokalemia: Secondary | ICD-10-CM

## 2017-12-02 DIAGNOSIS — R197 Diarrhea, unspecified: Secondary | ICD-10-CM | POA: Insufficient documentation

## 2017-12-02 DIAGNOSIS — F17228 Nicotine dependence, chewing tobacco, with other nicotine-induced disorders: Secondary | ICD-10-CM | POA: Diagnosis not present

## 2017-12-02 DIAGNOSIS — R631 Polydipsia: Secondary | ICD-10-CM | POA: Insufficient documentation

## 2017-12-02 DIAGNOSIS — Z5181 Encounter for therapeutic drug level monitoring: Secondary | ICD-10-CM | POA: Insufficient documentation

## 2017-12-02 DIAGNOSIS — Z79899 Other long term (current) drug therapy: Secondary | ICD-10-CM | POA: Diagnosis not present

## 2017-12-02 HISTORY — DX: Bipolar disorder, unspecified: F31.9

## 2017-12-02 LAB — BASIC METABOLIC PANEL
Anion gap: 10 (ref 5–15)
BUN: 12 mg/dL (ref 6–20)
CO2: 28 mmol/L (ref 22–32)
Calcium: 8.3 mg/dL — ABNORMAL LOW (ref 8.9–10.3)
Chloride: 104 mmol/L (ref 98–111)
Creatinine, Ser: 0.61 mg/dL (ref 0.44–1.00)
GFR calc Af Amer: >60 mL/min (ref >60)
GLUCOSE: 87 mg/dL (ref 70–99)
POTASSIUM: 2.9 mmol/L — AB (ref 3.5–5.1)
Sodium: 142 mmol/L (ref 135–145)

## 2017-12-02 LAB — CBC WITH DIFFERENTIAL/PLATELET
Basophils Absolute: 0 10*3/uL (ref 0.0–0.1)
Basophils Relative: 0 %
EOS PCT: 2 %
Eosinophils Absolute: 0.2 10*3/uL (ref 0.0–0.7)
HEMATOCRIT: 35.3 % — AB (ref 36.0–46.0)
Hemoglobin: 12.1 g/dL (ref 12.0–15.0)
LYMPHS ABS: 1.8 10*3/uL (ref 0.7–4.0)
LYMPHS PCT: 18 %
MCH: 29.7 pg (ref 26.0–34.0)
MCHC: 34.3 g/dL (ref 30.0–36.0)
MCV: 86.7 fL (ref 78.0–100.0)
MONO ABS: 0.9 10*3/uL (ref 0.1–1.0)
Monocytes Relative: 10 %
Neutro Abs: 7 10*3/uL (ref 1.7–7.7)
Neutrophils Relative %: 70 %
PLATELETS: 221 10*3/uL (ref 150–400)
RBC: 4.07 MIL/uL (ref 3.87–5.11)
RDW: 11.9 % (ref 11.5–15.5)
WBC: 9.9 10*3/uL (ref 4.0–10.5)

## 2017-12-02 LAB — LITHIUM LEVEL: Lithium Lvl: 0.4 mmol/L — ABNORMAL LOW (ref 0.60–1.20)

## 2017-12-02 LAB — PREGNANCY, URINE: Preg Test, Ur: NEGATIVE

## 2017-12-02 MED ORDER — POTASSIUM CHLORIDE 10 MEQ/100ML IV SOLN
10.0000 meq | Freq: Once | INTRAVENOUS | Status: AC
  Administered 2017-12-02: 10 meq via INTRAVENOUS
  Filled 2017-12-02: qty 100

## 2017-12-02 MED ORDER — POTASSIUM CHLORIDE CRYS ER 20 MEQ PO TBCR: 40.0000 meq | EXTENDED_RELEASE_TABLET | Freq: Every day | ORAL | 0 refills | Status: AC

## 2017-12-02 MED ORDER — POTASSIUM CHLORIDE CRYS ER 20 MEQ PO TBCR
40.0000 meq | EXTENDED_RELEASE_TABLET | Freq: Once | ORAL | Status: AC
  Administered 2017-12-02: 40 meq via ORAL
  Filled 2017-12-02: qty 2

## 2017-12-02 MED ORDER — SODIUM CHLORIDE 0.9 % IV BOLUS
500.0000 mL | Freq: Once | INTRAVENOUS | Status: AC
  Administered 2017-12-02: 500 mL via INTRAVENOUS

## 2017-12-02 NOTE — Discharge Instructions (Addendum)
Please see the information and instructions below regarding your visit.  Your diagnoses today include:  1. Diarrhea, unspecified type   2. Encounter for lithium monitoring   3. Hypokalemia    Your work-up is very reassuring today.  Your lithium level is not too high.  Tests performed today include: See side panel of your discharge paperwork for testing performed today. Vital signs are listed at the bottom of these instructions.   Medications prescribed:    Take any prescribed medications only as prescribed, and any over the counter medications only as directed on the packaging.  On testing today, your potassium level was 2.9 which is below normal. I suspect this is due to diarrhea. Increasing potassium in your diet should be sufficient to make sure you are getting enough potassium. Below is a list of good sources and servings high in potassium (in order from greatest to least):  -1 cup cooked acorn squash -1 baked potato with skin -1 cup cooked spinach -1 cup cooked lentils -1 cup cooked kidney beans -Watermelon - cup raisins -1 cup plain yogurt -1 cup orange juice, frozen -Banana -1 cup 1% low-fat milk -1 cup cooked broccoli   Home care instructions:  Please follow any educational materials contained in this packet.   Follow-up instructions: Please follow-up with your primary care provider in one week for further evaluation of your electrolytes.   Please follow up with Psychiatry.  Return instructions:  Please return to the Emergency Department if you experience worsening symptoms.  Please return in the emergency department for any worsening symptoms, muscle spasming, sensation of pins-and-needles or numbness, abdominal pain, nausea, or vomiting. Please return if you have any other emergent concerns.  Additional Information:   Your vital signs today were: BP 134/71    Pulse 69    Temp 98.2 F (36.8 C) (Oral)    Resp 16    Ht 5\' 3"  (1.6 m)    Wt 90.3 kg    LMP  11/29/2017    SpO2 100%    BMI 35.25 kg/m  If your blood pressure (BP) was elevated on multiple readings during this visit above 130 for the top number or above 80 for the bottom number, please have this repeated by your primary care provider within one month. --------------  Thank you for allowing [place] to participate in your care today.

## 2017-12-02 NOTE — ED Triage Notes (Signed)
She was on Lithium for 2 months. She was started on steroids for MS for a week. She feels anxious, having diarrhea and has fatigue.

## 2017-12-02 NOTE — ED Provider Notes (Signed)
[facility] EMERGENCY DEPARTMENT Provider Note   CSN: [id number] Arrival date & time: 12/02/17  1350     History   Chief Complaint Chief Complaint  Patient presents with  . Panic Attack    HPI Emily Howe is a 32 y.o. female.  HPI  Patient is a 32 year old female with a history of bipolar 1 disorder, multiple sclerosis presenting for diarrhea, anxiety, and increased thirst.  Patient reports she was recently started on lithium 2 weeks ago, and has not yet had a level checked.  She also reports she was started on Augmentin by an urgent care for otitis media, and has taken it for approximately 5 days.  Patient reports that she has had 5 both episodes watery diarrhea without melena or hematochezia today.  Patient reports she was also recently on a burst of steroids for possible optic neuritis.  She reports she finished the Solu-Medrol yesterday.  Patient has any fevers, chills, nausea, vomiting, dysuria, urgency, frequency.  Patient does reports increased thirst, and does not know if her urine is more pale than usual, as she is drinking significant amounts of water.  Past Medical History:  Diagnosis Date  . Bipolar 1 disorder (HCC)   . Blurry vision   . Chronic migraine   . Depression   . Memory loss   . MS (multiple sclerosis) (HCC)   . Numbness   . Vertigo     There are no active problems to display for this patient.   Past Surgical History:  Procedure Laterality Date  . APPENDECTOMY    . CHOLECYSTECTOMY    . KNEE SURGERY    . TONSILLECTOMY    . TUBAL LIGATION       OB History   None      Home Medications    Prior to Admission medications   Medication Sig Start Date End Date Taking? Authorizing Provider  acyclovir (ZOVIRAX) 200 MG capsule Take 200 mg by mouth 5 (five) times daily.   Yes [provider]  lithium 300 MG tablet Take 300 mg by mouth 3 (three) times daily.   Yes [provider]  propranolol (INDERAL) 10 MG tablet Take 10  mg by mouth every evening.    Yes [provider]  ibuprofen (ADVIL,MOTRIN) 800 MG tablet Take 1 tablet (800 mg total) by mouth every 8 (eight) hours as needed. 05/12/17   Long, Arlyss Repress, MD  omeprazole (PRILOSEC) 20 MG capsule Take 1 capsule (20 mg total) by mouth 2 (two) times daily. 12/28/16   Rolland Porter, MD  Peginterferon Beta-1a (PLEGRIDY [place]) Inject 160 mg into the skin.    [provider]  traMADol (ULTRAM) 50 MG tablet Take 1 tablet (50 mg total) by mouth every 6 (six) hours as needed. 05/12/17   Long, Arlyss Repress, MD  traZODone (DESYREL) 50 MG tablet Take 1 tablet (50 mg total) by mouth at bedtime. 12/28/16   Rolland Porter, MD    Family History Family History  Problem Relation Age of Onset  . Diabetes Other   . Hypertension Other     Social History Social History   Tobacco Use  . Smoking status: Former Smoker    Packs/day: 0.00    Last attempt to quit: 08/20/2015    Years since quitting: 2.2  . Smokeless tobacco: Current User  Substance Use Topics  . Alcohol use: No  . Drug use: No     Allergies   Doxycycline and Reglan [metoclopramide]   Review of Systems  Review of Systems  Constitutional: Negative for chills and fever.  HENT: Negative for congestion, rhinorrhea, sinus pain and sore throat.   Eyes: Negative for visual disturbance.  Respiratory: Negative for cough, chest tightness and shortness of breath.   Cardiovascular: Negative for chest pain, palpitations and leg swelling.  Gastrointestinal: Positive for diarrhea. Negative for abdominal pain, nausea and vomiting.  Genitourinary: Negative for dysuria and flank pain.  Musculoskeletal: Negative for back pain and myalgias.  Skin: Negative for rash.  Neurological: Negative for dizziness, syncope, light-headedness and headaches.  Psychiatric/Behavioral: The patient is nervous/anxious.      Physical Exam Updated Vital Signs BP 125/76   Pulse (!) 56   Temp 98.2 F (36.8 C) (Oral)   Resp 20   Ht  5\' 3"  (1.6 m)   Wt 90.3 kg   LMP 11/29/2017   SpO2 100%   BMI 35.25 kg/m   Physical Exam  Constitutional: She appears well-developed and well-nourished. No distress.  HENT:  Head: Normocephalic and atraumatic.  Mouth/Throat: Oropharynx is clear and moist.  Right TM normal. Left TM normal. Bony landmarks identified without evidence of effusion.  Eyes: Pupils are equal, round, and reactive to light. Conjunctivae and EOM are normal.  Neck: Normal range of motion. Neck supple.  Cardiovascular: Normal rate, regular rhythm, S1 normal and S2 normal.  No murmur heard. Pulmonary/Chest: Effort normal and breath sounds normal. She has no wheezes. She has no rales.  Abdominal: Soft. She exhibits no distension. There is no tenderness. There is no guarding.  Musculoskeletal: Normal range of motion. She exhibits no edema or deformity.  Lymphadenopathy:    She has no cervical adenopathy.  Neurological: She is alert.  Cranial nerves grossly intact. Patient moves extremities symmetrically and with good coordination.  Skin: Skin is warm and dry. No rash noted. No erythema.  Psychiatric: She has a normal mood and affect. Her behavior is normal. Judgment and thought content normal.  Nursing note and vitals reviewed.    ED Treatments / Results  Labs (all labs ordered are listed, but only abnormal results are displayed) Labs Reviewed  LITHIUM LEVEL - Abnormal; Notable for the following components:      Result Value   Lithium Lvl 0.40 (*)    All other components within normal limits  CBC WITH DIFFERENTIAL/PLATELET - Abnormal; Notable for the following components:   HCT 35.3 (*)    All other components within normal limits  BASIC METABOLIC PANEL - Abnormal; Notable for the following components:   Potassium 2.9 (*)    Calcium 8.3 (*)    All other components within normal limits  PREGNANCY, URINE    EKG EKG Interpretation  Date/Time:  Tuesday December 02 2017 16:59:09 EDT Ventricular Rate:   58 PR Interval:    QRS Duration: 118 QT Interval:  447 QTC Calculation: 439 R Axis:   16 Text Interpretation:  Sinus arrhythmia Incomplete right bundle branch block Baseline wander in lead(s) II III aVR aVL aVF V1 no change from previous Confirmed by Arby Barrette [phone]) on 12/02/2017 5:01:56 PM   Radiology No results found.  Procedures Procedures (including critical care time)  Medications Ordered in ED Medications - No data to display   Initial Impression / Assessment and Plan / ED Course  I have reviewed the triage vital signs and the nursing notes.  Pertinent labs & imaging results that were available during my care of the patient were reviewed by me and considered in my medical decision making (see chart for  details).  Clinical Course as of Dec 03 1946  Tue Dec 02, 2017  1934 Noted. Patient is not lithium toxic.   Lithium(!): 0.40 [AM]  1947 Will replete.   Potassium(!): 2.9 [AM]    Clinical Course User Index [AM] Elisha Ponder, PA-C    Patient is well-appearing and in no acute distress.  Patient without any diarrhea during emergency department course.  Patient with possible lithium toxicity, she has had recent changes in fluid status with diarrhea.  Will assess lithium level, electrolytes, CBC, and EKG.  Work-up significant for low lithium level at 0.4.  Given that patient has had some ongoing diarrhea, will not adjust.  Patient has upcoming recheck in 2 weeks.  Potassium is 2.9.  This may be related to medication versus recent diarrhea.  Repleted in the emergency department, and will send patient home with home course.  EKG without prolonged QT or other interval abnormality.  Suspect that patient's sense of anxiousness is from recent high-dose steroids, and diarrhea from recent antibiotic use.  Recommended probiotics.  Patient will follow-up in 1 week for lateral recheck with primary care provider, and has psychiatry recheck in 2 weeks.  Patient can return  precautions for any muscle spasms, paresthesias, blurred vision, worsening diarrhea, or any abdominal pain or nausea or vomiting.  Patient is in understanding and agrees with the plan of care.  Potassium management and patient case discussed with Dr. Arby Barrette.   Final Clinical Impressions(s) / ED Diagnoses   Final diagnoses:  Diarrhea, unspecified type  Encounter for lithium monitoring  Hypokalemia    ED Discharge Orders         Ordered    potassium chloride SA (K-DUR,KLOR-CON) 20 MEQ tablet  Daily     12/02/17 1952           Delia Chimes 12/02/17 Brooke Pace    Arby Barrette, MD 12/03/17 2036

## 2017-12-02 NOTE — ED Notes (Signed)
Pt. Reports she is on Lithium as a normal reg.  And was placed on steroids for MS for 5 days ending yesterday.. Diarrhea this morning 4-5 bouts last stool was an hour ago.  No vomiting and no other abd. Pain.  Pt. Woke feeling anxious and abnormal.  Pt. Stated she is concerned about lithium level may be out of normal or she feels she may be dehydrated

## 2017-12-02 NOTE — ED Notes (Signed)
Pt. Very pleasant and kind just concerned about he anxiety.

## 2017-12-03 ENCOUNTER — Emergency Department (HOSPITAL_BASED_OUTPATIENT_CLINIC_OR_DEPARTMENT_OTHER)
Admission: EM | Admit: 2017-12-03 | Discharge: 2017-12-03 | Disposition: A | Discharge status: Home / Self Care | Payer: 59 | Attending: Emergency Medicine | Admitting: Emergency Medicine

## 2017-12-03 ENCOUNTER — Encounter (HOSPITAL_BASED_OUTPATIENT_CLINIC_OR_DEPARTMENT_OTHER): Payer: Self-pay | Admitting: *Deleted

## 2017-12-03 ENCOUNTER — Other Ambulatory Visit: Payer: Self-pay

## 2017-12-03 DIAGNOSIS — F419 Anxiety disorder, unspecified: Secondary | ICD-10-CM | POA: Insufficient documentation

## 2017-12-03 DIAGNOSIS — Z79899 Other long term (current) drug therapy: Secondary | ICD-10-CM | POA: Insufficient documentation

## 2017-12-03 DIAGNOSIS — Z87891 Personal history of nicotine dependence: Secondary | ICD-10-CM | POA: Diagnosis not present

## 2017-12-03 DIAGNOSIS — R42 Dizziness and giddiness: Secondary | ICD-10-CM | POA: Diagnosis present

## 2017-12-03 DIAGNOSIS — G35 Multiple sclerosis: Secondary | ICD-10-CM | POA: Insufficient documentation

## 2017-12-03 MED ORDER — LORAZEPAM 1 MG PO TABS
1.0000 mg | ORAL_TABLET | Freq: Once | ORAL | Status: AC
  Administered 2017-12-03: 1 mg via ORAL
  Filled 2017-12-03: qty 1

## 2017-12-03 MED ORDER — LORAZEPAM 1 MG PO TABS: 1.0000 mg | ORAL_TABLET | Freq: Three times a day (TID) | ORAL | 0 refills | Status: AC | PRN

## 2017-12-03 MED ORDER — POTASSIUM CHLORIDE CRYS ER 20 MEQ PO TBCR
40.0000 meq | EXTENDED_RELEASE_TABLET | Freq: Once | ORAL | Status: AC
  Administered 2017-12-03: 40 meq via ORAL
  Filled 2017-12-03: qty 2

## 2017-12-03 MED FILL — LORazepam 1 MG TABS: 1 | 3 days supply | Qty: 9 | Fill #0

## 2017-12-03 NOTE — ED Provider Notes (Signed)
[facility] EMERGENCY DEPARTMENT Provider Note   CSN: [id number] Arrival date & time: 12/03/17  1058     History   Chief Complaint Chief Complaint  Patient presents with  . Dizziness    HPI Emily Howe is a 32 y.o. female.  HPI Patient is a 32 year old female who is currently finishing a steroid burst for a flare of her multiple sclerosis.  She tends to get flares of optic neuritis.  She states over the past 48 hours she is felt extremely anxious with intermittent panic attacks.  She is never responded to steroids in this fashion.  She reports a history of depression and mild anxiety but nothing as severe as this.  She is been sleeping well.  No fevers or chills.  No other new complaints.  Symptoms are mild to moderate in severity.  Seen last in the emergency department and found to be hypokalemic.  She has not filled her potassium supplementation yet.   Past Medical History:  Diagnosis Date  . Bipolar 1 disorder (HCC)   . Blurry vision   . Chronic migraine   . Depression   . Memory loss   . MS (multiple sclerosis) (HCC)   . Numbness   . Vertigo     There are no active problems to display for this patient.   Past Surgical History:  Procedure Laterality Date  . APPENDECTOMY    . CHOLECYSTECTOMY    . KNEE SURGERY    . TONSILLECTOMY    . TUBAL LIGATION       OB History   None      Home Medications    Prior to Admission medications   Medication Sig Start Date End Date Taking? Authorizing Provider  acyclovir (ZOVIRAX) 200 MG capsule Take 200 mg by mouth 5 (five) times daily.    [provider]  ibuprofen (ADVIL,MOTRIN) 800 MG tablet Take 1 tablet (800 mg total) by mouth every 8 (eight) hours as needed. 05/12/17   Long, Arlyss Repress, MD  lithium 300 MG tablet Take 300 mg by mouth 3 (three) times daily.    [provider]  LORazepam (ATIVAN) 1 MG tablet Take 1 tablet (1 mg total) by mouth 3 (three) times daily as needed for up to 9 doses for  anxiety. 12/03/17   Azalia Bilis, MD  omeprazole (PRILOSEC) 20 MG capsule Take 1 capsule (20 mg total) by mouth 2 (two) times daily. 12/28/16   Rolland Porter, MD  Peginterferon Beta-1a (PLEGRIDY [place]) Inject 160 mg into the skin.    [provider]  potassium chloride SA (K-DUR,KLOR-CON) 20 MEQ tablet Take 2 tablets (40 mEq total) by mouth daily for 3 days. 12/02/17 12/05/17  Aviva Kluver B, PA-C  propranolol (INDERAL) 10 MG tablet Take 10 mg by mouth every evening.     [provider]  traMADol (ULTRAM) 50 MG tablet Take 1 tablet (50 mg total) by mouth every 6 (six) hours as needed. 05/12/17   Long, Arlyss Repress, MD  traZODone (DESYREL) 50 MG tablet Take 1 tablet (50 mg total) by mouth at bedtime. 12/28/16   Rolland Porter, MD    Family History Family History  Problem Relation Age of Onset  . Diabetes Other   . Hypertension Other     Social History Social History   Tobacco Use  . Smoking status: Former Smoker    Packs/day: 0.00    Last attempt to quit: 08/20/2015    Years since quitting: 2.2  . Smokeless tobacco:  Current User  Substance Use Topics  . Alcohol use: No  . Drug use: No     Allergies   Doxycycline and Reglan [metoclopramide]   Review of Systems Review of Systems  All other systems reviewed and are negative.    Physical Exam Updated Vital Signs BP 134/82 (BP Location: Right Arm)   Pulse 68   Temp 98.1 F (36.7 C) (Oral)   Resp 16   Ht 5\' 3"  (1.6 m)   Wt 90.3 kg   LMP 11/29/2017   SpO2 100%   BMI 35.25 kg/m   Physical Exam  Constitutional: She is oriented to person, place, and time. She appears well-developed and well-nourished. No distress.  HENT:  Head: Normocephalic and atraumatic.  Eyes: EOM are normal.  Neck: Normal range of motion.  Cardiovascular: Normal rate, regular rhythm and normal heart sounds.  Pulmonary/Chest: Effort normal and breath sounds normal.  Abdominal: Soft. She exhibits no distension. There is no tenderness.    Musculoskeletal: Normal range of motion.  Neurological: She is alert and oriented to person, place, and time.  Skin: Skin is warm and dry.  Psychiatric:  Slightly anxious appearing  Nursing note and vitals reviewed.    ED Treatments / Results  Labs (all labs ordered are listed, but only abnormal results are displayed) Labs Reviewed - No data to display  EKG None  Radiology No results found.  Procedures Procedures (including critical care time)  Medications Ordered in ED Medications  potassium chloride SA (K-DUR,KLOR-CON) CR tablet 40 mEq (40 mEq Oral Given 12/03/17 1333)  LORazepam (ATIVAN) tablet 1 mg (1 mg Oral Given 12/03/17 1333)     Initial Impression / Assessment and Plan / ED Course  I have reviewed the triage vital signs and the nursing notes.  Pertinent labs & imaging results that were available during my care of the patient were reviewed by me and considered in my medical decision making (see chart for details).     Is much better after single dose benzodiazepine here in the emergency department.  Patient will be discharged home with primary care follow-up.  May benefit from seeing a therapist and psychiatrist.  She may benefit from initiation of an antidepressant/antianxiety medicine by her primary team.  Patient will be discharged home with 9 doses of Ativan  Final Clinical Impressions(s) / ED Diagnoses   Final diagnoses:  Anxiety    ED Discharge Orders         Ordered    LORazepam (ATIVAN) 1 MG tablet  3 times daily PRN     12/03/17 1424           Azalia Bilis, MD 12/03/17 1428

## 2017-12-03 NOTE — ED Triage Notes (Signed)
Pt reports feeling "like I'm having panic attacks over and over" x 2 days, seen here yesterday and given potassium iv and po, pt states she still feels dizzy and anxious, and has generalized aching in her body

## 2018-01-13 ENCOUNTER — Other Ambulatory Visit: Payer: Self-pay

## 2018-01-13 ENCOUNTER — Emergency Department (HOSPITAL_BASED_OUTPATIENT_CLINIC_OR_DEPARTMENT_OTHER)
Admission: EM | Admit: 2018-01-13 | Discharge: 2018-01-13 | Disposition: A | Discharge status: Home / Self Care | Payer: 59 | Attending: Emergency Medicine | Admitting: Emergency Medicine

## 2018-01-13 ENCOUNTER — Emergency Department (HOSPITAL_BASED_OUTPATIENT_CLINIC_OR_DEPARTMENT_OTHER): Payer: 59

## 2018-01-13 ENCOUNTER — Encounter (HOSPITAL_BASED_OUTPATIENT_CLINIC_OR_DEPARTMENT_OTHER): Payer: Self-pay | Admitting: Emergency Medicine

## 2018-01-13 DIAGNOSIS — Z87891 Personal history of nicotine dependence: Secondary | ICD-10-CM | POA: Diagnosis not present

## 2018-01-13 DIAGNOSIS — R1110 Vomiting, unspecified: Secondary | ICD-10-CM | POA: Diagnosis present

## 2018-01-13 DIAGNOSIS — R0789 Other chest pain: Secondary | ICD-10-CM | POA: Diagnosis not present

## 2018-01-13 DIAGNOSIS — Z79899 Other long term (current) drug therapy: Secondary | ICD-10-CM | POA: Diagnosis not present

## 2018-01-13 LAB — COMPREHENSIVE METABOLIC PANEL
ALK PHOS: 40 U/L (ref 38–126)
ALT: 12 U/L (ref 0–44)
AST: 14 U/L — ABNORMAL LOW (ref 15–41)
Albumin: 4.2 g/dL (ref 3.5–5.0)
Anion gap: 9 (ref 5–15)
BUN: 13 mg/dL (ref 6–20)
CALCIUM: 9.4 mg/dL (ref 8.9–10.3)
CHLORIDE: 107 mmol/L (ref 98–111)
CO2: 23 mmol/L (ref 22–32)
CREATININE: 0.7 mg/dL (ref 0.44–1.00)
Glucose, Bld: 93 mg/dL (ref 70–99)
Potassium: 3.7 mmol/L (ref 3.5–5.1)
Sodium: 139 mmol/L (ref 135–145)
Total Bilirubin: 0.6 mg/dL (ref 0.3–1.2)
Total Protein: 6.9 g/dL (ref 6.5–8.1)

## 2018-01-13 LAB — CBC WITH DIFFERENTIAL/PLATELET
Abs Immature Granulocytes: 0.02 10*3/uL (ref 0.00–0.07)
BASOS ABS: 0.1 10*3/uL (ref 0.0–0.1)
BASOS PCT: 1 %
EOS PCT: 2 %
Eosinophils Absolute: 0.1 10*3/uL (ref 0.0–0.5)
HCT: 35.7 % — ABNORMAL LOW (ref 36.0–46.0)
Hemoglobin: 11.7 g/dL — ABNORMAL LOW (ref 12.0–15.0)
IMMATURE GRANULOCYTES: 0 %
LYMPHS ABS: 0.9 10*3/uL (ref 0.7–4.0)
Lymphocytes Relative: 14 %
MCH: 28.3 pg (ref 26.0–34.0)
MCHC: 32.8 g/dL (ref 30.0–36.0)
MCV: 86.4 fL (ref 80.0–100.0)
Monocytes Absolute: 0.6 10*3/uL (ref 0.1–1.0)
Monocytes Relative: 10 %
NEUTROS PCT: 73 %
NRBC: 0 % (ref 0.0–0.2)
Neutro Abs: 4.7 10*3/uL (ref 1.7–7.7)
PLATELETS: 220 10*3/uL (ref 150–400)
RBC: 4.13 MIL/uL (ref 3.87–5.11)
RDW: 12.3 % (ref 11.5–15.5)
WBC: 6.4 10*3/uL (ref 4.0–10.5)

## 2018-01-13 LAB — LIPASE, BLOOD: LIPASE: 30 U/L (ref 11–51)

## 2018-01-13 LAB — PREGNANCY, URINE: PREG TEST UR: NEGATIVE

## 2018-01-13 MED ORDER — ONDANSETRON HCL 4 MG/2ML IJ SOLN
4.0000 mg | Freq: Once | INTRAMUSCULAR | Status: AC
  Administered 2018-01-13: 4 mg via INTRAVENOUS
  Filled 2018-01-13: qty 2

## 2018-01-13 MED ORDER — ALUM & MAG HYDROXIDE-SIMETH 200-200-20 MG/5ML PO SUSP: 15.0000 mL | Freq: Once | ORAL | Status: DC

## 2018-01-13 MED ORDER — FAMOTIDINE 20 MG PO TABS
40.0000 mg | ORAL_TABLET | Freq: Once | ORAL | Status: AC
  Administered 2018-01-13: 40 mg via ORAL
  Filled 2018-01-13: qty 2

## 2018-01-13 MED ORDER — ONDANSETRON 4 MG PO TBDP: ORAL_TABLET | ORAL | 0 refills | Status: AC

## 2018-01-13 NOTE — ED Triage Notes (Signed)
Pain in left side of neck, left arm, and upper chest x3 days.  Vomiting  But no diarrhea.

## 2018-01-13 NOTE — ED Provider Notes (Signed)
[facility] EMERGENCY DEPARTMENT Provider Note   CSN: [id number] Arrival date & time: 01/13/18  4098     History   Chief Complaint Chief Complaint  Patient presents with  . Emesis    HPI Emily Howe is a 32 y.o. female.  32 yo F with a chief complaint of nausea and vomiting.  Going on for the past 3 days.  Feels like she is having left lateral chest pain that radiates down her arm as well.  Nothing seems to make the nausea and vomiting better nothing seems to make it worse.  Chest pain is worse with movement palpation twisting.  She denies fevers or chills.  Denies diarrhea.  Denies sick contacts.  Denies history of PE or DVT.  Denies hemoptysis denies unilateral lower extremity edema denies estrogen use denies prolonged immobilization or travel.  The history is provided by the patient.  Emesis   This is a new problem. The problem occurs 5 to 10 times per day. The problem has been gradually worsening. The emesis has an appearance of stomach contents. There has been no fever. Pertinent negatives include no arthralgias, no chills, no fever, no headaches and no myalgias.    Past Medical History:  Diagnosis Date  . Bipolar 1 disorder (HCC)   . Blurry vision   . Chronic migraine   . Depression   . Memory loss   . MS (multiple sclerosis) (HCC)   . Numbness   . Vertigo     There are no active problems to display for this patient.   Past Surgical History:  Procedure Laterality Date  . APPENDECTOMY    . CHOLECYSTECTOMY    . KNEE SURGERY    . TONSILLECTOMY    . TUBAL LIGATION       OB History   None      Home Medications    Prior to Admission medications   Medication Sig Start Date End Date Taking? Authorizing Provider  acyclovir (ZOVIRAX) 200 MG capsule Take 200 mg by mouth 5 (five) times daily.    [provider]  ibuprofen (ADVIL,MOTRIN) 800 MG tablet Take 1 tablet (800 mg total) by mouth every 8 (eight) hours as needed. 05/12/17   Long, Arlyss Repress, MD  lithium 300 MG tablet Take 300 mg by mouth 3 (three) times daily.    [provider]  LORazepam (ATIVAN) 1 MG tablet Take 1 tablet (1 mg total) by mouth 3 (three) times daily as needed for up to 9 doses for anxiety. 12/03/17   Azalia Bilis, MD  omeprazole (PRILOSEC) 20 MG capsule Take 1 capsule (20 mg total) by mouth 2 (two) times daily. 12/28/16   Rolland Porter, MD  ondansetron (ZOFRAN ODT) 4 MG disintegrating tablet 4mg  ODT q4 hours prn nausea/vomit 01/13/18   Melene Plan, DO  Peginterferon Beta-1a (PLEGRIDY [place]) Inject 160 mg into the skin.    [provider]  potassium chloride SA (K-DUR,KLOR-CON) 20 MEQ tablet Take 2 tablets (40 mEq total) by mouth daily for 3 days. 12/02/17 12/05/17  Aviva Kluver B, PA-C  propranolol (INDERAL) 10 MG tablet Take 10 mg by mouth every evening.     [provider]  traMADol (ULTRAM) 50 MG tablet Take 1 tablet (50 mg total) by mouth every 6 (six) hours as needed. 05/12/17   Long, Arlyss Repress, MD  traZODone (DESYREL) 50 MG tablet Take 1 tablet (50 mg total) by mouth at bedtime. 12/28/16   Rolland Porter, MD    Family  History Family History  Problem Relation Age of Onset  . Diabetes Other   . Hypertension Other     Social History Social History   Tobacco Use  . Smoking status: Former Smoker    Packs/day: 0.00    Last attempt to quit: 08/20/2015    Years since quitting: 2.4  . Smokeless tobacco: Current User  Substance Use Topics  . Alcohol use: No  . Drug use: No     Allergies   Doxycycline and Reglan [metoclopramide]   Review of Systems Review of Systems  Constitutional: Negative for chills and fever.  HENT: Negative for congestion and rhinorrhea.   Eyes: Negative for redness and visual disturbance.  Respiratory: Negative for shortness of breath and wheezing.   Cardiovascular: Positive for chest pain. Negative for palpitations.  Gastrointestinal: Positive for nausea and vomiting.  Genitourinary: Negative for dysuria  and urgency.  Musculoskeletal: Negative for arthralgias and myalgias.  Skin: Negative for pallor and wound.  Neurological: Negative for dizziness and headaches.     Physical Exam Updated Vital Signs BP 118/79 (BP Location: Right Arm)   Pulse 67   Temp 98.3 F (36.8 C) (Oral)   Resp (!) 65   Ht 5\' 3"  (1.6 m)   Wt 88.5 kg   LMP 12/30/2017   SpO2 100%   BMI 34.54 kg/m   Physical Exam  Constitutional: She is oriented to person, place, and time. She appears well-developed and well-nourished. No distress.  HENT:  Head: Normocephalic and atraumatic.  Eyes: Pupils are equal, round, and reactive to light. EOM are normal.  Neck: Normal range of motion. Neck supple.  Cardiovascular: Normal rate and regular rhythm. Exam reveals no gallop and no friction rub.  No murmur heard. Pulmonary/Chest: Effort normal. She has no wheezes. She has no rales.  Abdominal: Soft. She exhibits no distension and no mass. There is tenderness (epigastric, no pain in the right upper quadrant negative Murphy's). There is no guarding.  Musculoskeletal: She exhibits no edema or tenderness.  Neurological: She is alert and oriented to person, place, and time.  Skin: Skin is warm and dry. She is not diaphoretic.  Psychiatric: She has a normal mood and affect. Her behavior is normal.  Nursing note and vitals reviewed.    ED Treatments / Results  Labs (all labs ordered are listed, but only abnormal results are displayed) Labs Reviewed  CBC WITH DIFFERENTIAL/PLATELET - Abnormal; Notable for the following components:      Result Value   Hemoglobin 11.7 (*)    HCT 35.7 (*)    All other components within normal limits  COMPREHENSIVE METABOLIC PANEL - Abnormal; Notable for the following components:   AST 14 (*)    All other components within normal limits  LIPASE, BLOOD  PREGNANCY, URINE    EKG EKG Interpretation  Date/Time:  Tuesday January 13 2018 09:59:17 EST Ventricular Rate:  58 PR Interval:      QRS Duration: 118 QT Interval:  412 QTC Calculation: 405 R Axis:   -4 Text Interpretation:  Sinus arrhythmia Incomplete right bundle branch block Low voltage, precordial leads No significant change since last tracing Confirmed by Melene Plan [phone]) on 01/13/2018 10:02:31 AM   Radiology Dg Chest 2 View  Result Date: 01/13/2018 CLINICAL DATA:  Chest pain and shortness of breath for 3 days. EXAM: CHEST - 2 VIEW COMPARISON:  PA and lateral chest 12/07/2017 and 01/15/2016. FINDINGS: Lungs are clear. Heart size is normal. No pneumothorax or pleural fluid. No acute or  focal bony abnormality. Radiopaque densities in the left upper quadrant are likely pills within the stomach. IMPRESSION: No acute disease. Electronically Signed   By: Drusilla Kanner M.D.   On: 01/13/2018 10:07    Procedures Procedures (including critical care time)  Medications Ordered in ED Medications  alum & mag hydroxide-simeth (MAALOX/MYLANTA) 200-200-20 MG/5ML suspension 15 mL (has no administration in time range)  ondansetron (ZOFRAN) injection 4 mg (4 mg Intravenous Given 01/13/18 1031)  famotidine (PEPCID) tablet 40 mg (40 mg Oral Given 01/13/18 1034)     Initial Impression / Assessment and Plan / ED Course  I have reviewed the triage vital signs and the nursing notes.  Pertinent labs & imaging results that were available during my care of the patient were reviewed by me and considered in my medical decision making (see chart for details).     32 yo F with a chief complaints of nausea and vomiting.  Also having some chest pain.  Chest pain is most likely reflux by history though patient does have some palpable chest wall tenderness as well.  Will give fluids nausea medicine check abdominal labs chest x-ray EKG unremarkable as viewed by me.  Give Maalox.  Reassess.  The patient is feeling better on reassessment.  Much better after Pepcid.  Suspect that this is gastritis post vomiting.  Tolerating p.o. without  difficulty.  Discharge with Zofran I have her take Pepcid or Zantac at home.  PCP follow-up.  11:28 AM:  I have discussed the diagnosis/risks/treatment options with the patient and believe the pt to be eligible for discharge home to follow-up with PCP. We also discussed returning to the ED immediately if new or worsening sx occur. We discussed the sx which are most concerning (e.g., sudden worsening pain, fever, inability to tolerate by mouth) that necessitate immediate return. Medications administered to the patient during their visit and any new prescriptions provided to the patient are listed below.  Medications given during this visit Medications  alum & mag hydroxide-simeth (MAALOX/MYLANTA) 200-200-20 MG/5ML suspension 15 mL (has no administration in time range)  ondansetron (ZOFRAN) injection 4 mg (4 mg Intravenous Given 01/13/18 1031)  famotidine (PEPCID) tablet 40 mg (40 mg Oral Given 01/13/18 1034)      The patient appears reasonably screen and/or stabilized for discharge and I doubt any other medical condition or other [facility] requiring further screening, evaluation, or treatment in the ED at this time prior to discharge.    Final Clinical Impressions(s) / ED Diagnoses   Final diagnoses:  Atypical chest pain    ED Discharge Orders         Ordered    ondansetron (ZOFRAN ODT) 4 MG disintegrating tablet     01/13/18 1123           Melene Plan, DO 01/13/18 1128

## 2018-01-13 NOTE — Discharge Instructions (Signed)
Try zantac or pepcid twice a day.  Try to avoid things that may make this worse, most commonly these are spicy foods tomato based products fatty foods chocolate and peppermint.  Alcohol and tobacco can also make this worse.  Return to the emergency department for sudden worsening pain fever or inability to eat or drink. ° °

## 2018-03-12 ENCOUNTER — Ambulatory Visit (HOSPITAL_COMMUNITY): Payer: Self-pay | Admitting: Psychiatry

## 2018-03-24 ENCOUNTER — Ambulatory Visit (HOSPITAL_COMMUNITY): Payer: Self-pay | Admitting: Psychiatry

## 2019-12-27 ENCOUNTER — Emergency Department (HOSPITAL_BASED_OUTPATIENT_CLINIC_OR_DEPARTMENT_OTHER)
Admission: EM | Admit: 2019-12-27 | Discharge: 2019-12-28 | Disposition: A | Discharge status: Home / Self Care | Payer: 59 | Attending: Emergency Medicine | Admitting: Emergency Medicine

## 2019-12-27 ENCOUNTER — Other Ambulatory Visit: Payer: Self-pay

## 2019-12-27 ENCOUNTER — Encounter (HOSPITAL_BASED_OUTPATIENT_CLINIC_OR_DEPARTMENT_OTHER): Payer: Self-pay | Admitting: Emergency Medicine

## 2019-12-27 DIAGNOSIS — R0981 Nasal congestion: Secondary | ICD-10-CM | POA: Diagnosis present

## 2019-12-27 DIAGNOSIS — U071 COVID-19: Secondary | ICD-10-CM | POA: Diagnosis not present

## 2019-12-27 DIAGNOSIS — Z87891 Personal history of nicotine dependence: Secondary | ICD-10-CM | POA: Diagnosis not present

## 2019-12-27 MED ORDER — NAPROXEN 250 MG PO TABS
500.0000 mg | ORAL_TABLET | Freq: Once | ORAL | Status: AC
  Administered 2019-12-27: 500 mg via ORAL
  Filled 2019-12-27: qty 2

## 2019-12-27 MED ORDER — ACETAMINOPHEN 500 MG PO TABS
1000.0000 mg | ORAL_TABLET | Freq: Once | ORAL | Status: AC
  Administered 2019-12-27: 1000 mg via ORAL
  Filled 2019-12-27: qty 2

## 2019-12-27 MED ORDER — LIDOCAINE VISCOUS HCL 2 % MT SOLN
15.0000 mL | Freq: Once | OROMUCOSAL | Status: AC
  Administered 2019-12-28: 15 mL via OROMUCOSAL
  Filled 2019-12-27: qty 15

## 2019-12-27 NOTE — Discharge Instructions (Addendum)
Person Under Monitoring Name: Emily Howe  Location: [address] [place] [address]   Infection Prevention Recommendations for Individuals Confirmed to have, or Being Evaluated for, 2019 Novel Coronavirus (COVID-19) Infection Who Receive Care at Home  Individuals who are confirmed to have, or are being evaluated for, COVID-19 should follow the prevention steps below until a healthcare provider or local or state health department says they can return to normal activities.  Stay home except to get medical care You should restrict activities outside your home, except for getting medical care. Do not go to work, school, or public areas, and do not use public transportation or taxis.  Call ahead before visiting your doctor Before your medical appointment, call the healthcare provider and tell them that you have, or are being evaluated for, COVID-19 infection. This will help the healthcare provider's office take steps to keep other people from getting infected. Ask your healthcare provider to call the local or state health department.  Monitor your symptoms Seek prompt medical attention if your illness is worsening (e.g., difficulty breathing). Before going to your medical appointment, call the healthcare provider and tell them that you have, or are being evaluated for, COVID-19 infection. Ask your healthcare provider to call the local or state health department.  Wear a facemask You should wear a facemask that covers your nose and mouth when you are in the same room with other people and when you visit a healthcare provider. People who live with or visit you should also wear a facemask while they are in the same room with you.  Separate yourself from other people in your home As much as possible, you should stay in a different room from other people in your home. Also, you should use a separate bathroom, if available.  Avoid sharing household items You should not share  dishes, drinking glasses, cups, eating utensils, towels, bedding, or other items with other people in your home. After using these items, you should wash them thoroughly with soap and water.  Cover your coughs and sneezes Cover your mouth and nose with a tissue when you cough or sneeze, or you can cough or sneeze into your sleeve. Throw used tissues in a lined trash can, and immediately wash your hands with soap and water for at least 20 seconds or use an alcohol-based hand rub.  Wash your [employer] your hands often and thoroughly with soap and water for at least 20 seconds. You can use an alcohol-based hand sanitizer if soap and water are not available and if your hands are not visibly dirty. Avoid touching your eyes, nose, and mouth with unwashed hands.   Prevention Steps for Caregivers and Household Members of Individuals Confirmed to have, or Being Evaluated for, COVID-19 Infection Being Cared for in the Home  If you live with, or provide care at home for, a person confirmed to have, or being evaluated for, COVID-19 infection please follow these guidelines to prevent infection:  Follow healthcare provider's instructions Make sure that you understand and can help the patient follow any healthcare provider instructions for all care.  Provide for the patient's basic needs You should help the patient with basic needs in the home and provide support for getting groceries, prescriptions, and other personal needs.  Monitor the patient's symptoms If they are getting sicker, call his or her medical provider and tell them that the patient has, or is being evaluated for, COVID-19 infection. This will help the healthcare provider's office  take steps to keep other people from getting infected. Ask the healthcare provider to call the local or state health department.  Limit the number of people who have contact with the patient If possible, have only one caregiver for the patient. Other  household members should stay in another home or place of residence. If this is not possible, they should stay in another room, or be separated from the patient as much as possible. Use a separate bathroom, if available. Restrict visitors who do not have an essential need to be in the home.  Keep older adults, very young children, and other sick people away from the patient Keep older adults, very young children, and those who have compromised immune systems or chronic health conditions away from the patient. This includes people with chronic heart, lung, or kidney conditions, diabetes, and cancer.  Ensure good ventilation Make sure that shared spaces in the home have good air flow, such as from an air conditioner or an opened window, weather permitting.  Wash your hands often Wash your hands often and thoroughly with soap and water for at least 20 seconds. You can use an alcohol based hand sanitizer if soap and water are not available and if your hands are not visibly dirty. Avoid touching your eyes, nose, and mouth with unwashed hands. Use disposable paper towels to dry your hands. If not available, use dedicated cloth towels and replace them when they become wet.  Wear a facemask and gloves Wear a disposable facemask at all times in the room and gloves when you touch or have contact with the patient's blood, body fluids, and/or secretions or excretions, such as sweat, saliva, sputum, nasal mucus, vomit, urine, or feces.  Ensure the mask fits over your nose and mouth tightly, and do not touch it during use. Throw out disposable facemasks and gloves after using them. Do not reuse. Wash your hands immediately after removing your facemask and gloves. If your personal clothing becomes contaminated, carefully remove clothing and launder. Wash your hands after handling contaminated clothing. Place all used disposable facemasks, gloves, and other waste in a lined container before disposing them with  other household waste. Remove gloves and wash your hands immediately after handling these items.  Do not share dishes, glasses, or other household items with the patient Avoid sharing household items. You should not share dishes, drinking glasses, cups, eating utensils, towels, bedding, or other items with a patient who is confirmed to have, or being evaluated for, COVID-19 infection. After the person uses these items, you should wash them thoroughly with soap and water.  Wash laundry thoroughly Immediately remove and wash clothes or bedding that have blood, body fluids, and/or secretions or excretions, such as sweat, saliva, sputum, nasal mucus, vomit, urine, or feces, on them. Wear gloves when handling laundry from the patient. Read and follow directions on labels of laundry or clothing items and detergent. In general, wash and dry with the warmest temperatures recommended on the label.  Clean all areas the individual has used often Clean all touchable surfaces, such as counters, tabletops, doorknobs, bathroom fixtures, toilets, phones, keyboards, tablets, and bedside tables, every day. Also, clean any surfaces that may have blood, body fluids, and/or secretions or excretions on them. Wear gloves when cleaning surfaces the patient has come in contact with. Use a diluted bleach solution (e.g., dilute bleach with 1 part bleach and 10 parts water) or a household disinfectant with a label that says EPA-registered for coronaviruses. To make a bleach  solution at home, add 1 tablespoon of bleach to 1 quart (4 cups) of water. For a larger supply, add  cup of bleach to 1 gallon (16 cups) of water. Read labels of cleaning products and follow recommendations provided on product labels. Labels contain instructions for safe and effective use of the cleaning product including precautions you should take when applying the product, such as wearing gloves or eye protection and making sure you have good ventilation  during use of the product. Remove gloves and wash hands immediately after cleaning.  Monitor yourself for signs and symptoms of illness Caregivers and household members are considered close contacts, should monitor their health, and will be asked to limit movement outside of the home to the extent possible. Follow the monitoring steps for close contacts listed on the symptom monitoring form.   ? If you have additional questions, contact your local health department or call the epidemiologist on call at [phone] (available 24/7). ? This guidance is subject to change. For the most up-to-date guidance from [facility], please refer to their website: [email or web address]

## 2019-12-27 NOTE — ED Triage Notes (Signed)
Pt states she tested positive for Covid today  Pt states she is having shortness of breath, sore throat, weakness, and sore throat, and the worst headache she has ever had   Symptoms started on Friday

## 2019-12-27 NOTE — ED Provider Notes (Signed)
[facility] EMERGENCY DEPARTMENT Provider Note   CSN: [id number] Arrival date & time: 12/27/19  2105     History Chief Complaint  Patient presents with   Covid Positive    Emily Howe is a 34 y.o. female.  The history is provided by the patient.  URI Presenting symptoms: congestion, fatigue, rhinorrhea and sore throat   Presenting symptoms: no cough and no fever   Severity:  Moderate Onset quality:  Gradual Duration:  7 days Timing:  Constant Progression:  Worsening Chronicity:  New Relieved by:  Nothing Worsened by:  Nothing Ineffective treatments:  None tried Associated symptoms: no arthralgias, no neck pain and no wheezing   Risk factors: not elderly        Past Medical History:  Diagnosis Date   Bipolar 1 disorder (HCC)    Blurry vision    Chronic migraine    Depression    Memory loss    MS (multiple sclerosis) (HCC)    Numbness    Vertigo     There are no problems to display for this patient.   Past Surgical History:  Procedure Laterality Date   APPENDECTOMY     CHOLECYSTECTOMY     KNEE SURGERY     TONSILLECTOMY     TUBAL LIGATION       OB History   No obstetric history on file.     Family History  Problem Relation Age of Onset   Diabetes Other    Hypertension Other    Cancer Other     Social History   Tobacco Use   Smoking status: Former Smoker    Packs/day: 0.00    Quit date: 08/20/2015    Years since quitting: 4.3   Smokeless tobacco: Current User  Vaping Use   Vaping Use: Never used  Substance Use Topics   Alcohol use: No   Drug use: No    Home Medications Prior to Admission medications   Medication Sig Start Date End Date Taking? Authorizing Provider  acyclovir (ZOVIRAX) 200 MG capsule Take 200 mg by mouth 5 (five) times daily.    [provider]  ibuprofen (ADVIL,MOTRIN) 800 MG tablet Take 1 tablet (800 mg total) by mouth every 8 (eight) hours as needed. 05/12/17   Long, Arlyss Repress, MD  lithium 300 MG tablet Take 300 mg by mouth 3 (three) times daily.    [provider]  LORazepam (ATIVAN) 1 MG tablet Take 1 tablet (1 mg total) by mouth 3 (three) times daily as needed for up to 9 doses for anxiety. 12/03/17   Azalia Bilis, MD  omeprazole (PRILOSEC) 20 MG capsule Take 1 capsule (20 mg total) by mouth 2 (two) times daily. 12/28/16   Rolland Porter, MD  ondansetron (ZOFRAN ODT) 4 MG disintegrating tablet 4mg  ODT q4 hours prn nausea/vomit 01/13/18   13/5/19, DO  Peginterferon Beta-1a (PLEGRIDY Ione) Inject 160 mg into the skin.    [provider]  potassium chloride SA (K-DUR,KLOR-CON) 20 MEQ tablet Take 2 tablets (40 mEq total) by mouth daily for 3 days. 12/02/17 12/05/17  12/07/17 B, PA-C  propranolol (INDERAL) 10 MG tablet Take 10 mg by mouth every evening.     [provider]  traMADol (ULTRAM) 50 MG tablet Take 1 tablet (50 mg total) by mouth every 6 (six) hours as needed. 05/12/17   Long, 07/12/17, MD  traZODone (DESYREL) 50 MG tablet Take 1 tablet (50 mg total) by mouth at bedtime. 12/28/16  Rolland Porter, MD    Allergies    Doxycycline and Reglan [metoclopramide]  Review of Systems   Review of Systems  Constitutional: Positive for fatigue. Negative for fever.  HENT: Positive for congestion, rhinorrhea and sore throat.   Eyes: Negative for visual disturbance.  Respiratory: Negative for cough and wheezing.   Gastrointestinal: Negative for abdominal pain.  Genitourinary: Negative for difficulty urinating.  Musculoskeletal: Negative for arthralgias and neck pain.  Skin: Negative for rash.  Neurological: Negative for dizziness.  Psychiatric/Behavioral: Negative for agitation.  All other systems reviewed and are negative.   Physical Exam Updated Vital Signs BP 117/77 (BP Location: Left Arm)    Pulse 89    Temp 99.8 F (37.7 C) (Oral)    Resp 18    Ht 5\' 3"  (1.6 m)    Wt 104.3 kg    LMP 12/06/2019 (Approximate)    SpO2 97%    BMI 40.74  kg/m   Physical Exam Vitals and nursing note reviewed.  Constitutional:      General: She is not in acute distress.    Appearance: Normal appearance.  HENT:     Head: Normocephalic and atraumatic.     Nose: Nose normal.  Eyes:     Conjunctiva/sclera: Conjunctivae normal.     Pupils: Pupils are equal, round, and reactive to light.  Cardiovascular:     Rate and Rhythm: Normal rate and regular rhythm.     Pulses: Normal pulses.     Heart sounds: Normal heart sounds.  Pulmonary:     Effort: Pulmonary effort is normal.     Breath sounds: Normal breath sounds.  Abdominal:     General: Abdomen is flat. Bowel sounds are normal.     Palpations: Abdomen is soft.     Tenderness: There is no abdominal tenderness. There is no guarding or rebound.  Musculoskeletal:        General: Normal range of motion.     Cervical back: Normal range of motion and neck supple.  Skin:    General: Skin is warm and dry.     Capillary Refill: Capillary refill takes less than 2 seconds.  Neurological:     General: No focal deficit present.     Mental Status: She is alert and oriented to person, place, and time.     Deep Tendon Reflexes: Reflexes normal.  Psychiatric:        Mood and Affect: Mood normal.        Behavior: Behavior normal.     ED Results / Procedures / Treatments   Labs (all labs ordered are listed, but only abnormal results are displayed) Labs Reviewed - No data to display  EKG None  Radiology No results found.  Procedures Procedures (including critical care time)  Medications Ordered in ED Medications  lidocaine (XYLOCAINE) 2 % viscous mouth solution 15 mL (has no administration in time range)  acetaminophen (TYLENOL) tablet 1,000 mg (1,000 mg Oral Given 12/27/19 2337)  naproxen (NAPROSYN) tablet 500 mg (500 mg Oral Given 12/27/19 2336)    ED Course  I have reviewed the triage vital signs and the nursing notes.  Pertinent labs & imaging results that were available  during my care of the patient were reviewed by me and considered in my medical decision making (see chart for details).    Alternate tylenol and ibuprofen.  Gargle.  Obtain a pulse oximeter.  Return for oxygen < 90%.    Emily Howe was evaluated in Emergency Department on  12/27/2019 for the symptoms described in the history of present illness. She was evaluated in the context of the global COVID-19 pandemic, which necessitated consideration that the patient might be at risk for infection with the SARS-CoV-2 virus that causes COVID-19. Institutional protocols and algorithms that pertain to the evaluation of patients at risk for COVID-19 are in a state of rapid change based on information released by regulatory bodies including the CDC and federal and state organizations. These policies and algorithms were followed during the patient's care in the ED.  Final Clinical Impression(s) / ED Diagnoses Return for intractable cough, coughing up blood,fevers >100.4 unrelieved by medication, shortness of breath, intractable vomiting, chest pain, shortness of breath, weakness,numbness, changes in speech, facial asymmetry,abdominal pain, passing out,Inability to tolerate liquids or food, cough, altered mental status or any concerns. No signs of systemic illness or infection. The patient is nontoxic-appearing on exam and vital signs are within normal limits.   I have reviewed the triage vital signs and the nursing notes. Pertinent labs &imaging results that were available during my care of the patient were reviewed by me and considered in my medical decision making (see chart for details).After history, exam, and medical workup I feel the patient has beenappropriately medically screened and is safe for discharge home. Pertinent diagnoses were discussed with the patient. Patient was given return precautions.    Emily Heidel, MD 12/27/19 2355

## 2020-05-06 ENCOUNTER — Emergency Department (HOSPITAL_BASED_OUTPATIENT_CLINIC_OR_DEPARTMENT_OTHER)
Admission: EM | Admit: 2020-05-06 | Discharge: 2020-05-06 | Disposition: A | Discharge status: Home / Self Care | Payer: 59 | Attending: Emergency Medicine | Admitting: Emergency Medicine

## 2020-05-06 ENCOUNTER — Other Ambulatory Visit: Payer: Self-pay

## 2020-05-06 ENCOUNTER — Emergency Department (HOSPITAL_BASED_OUTPATIENT_CLINIC_OR_DEPARTMENT_OTHER): Payer: 59

## 2020-05-06 ENCOUNTER — Encounter (HOSPITAL_BASED_OUTPATIENT_CLINIC_OR_DEPARTMENT_OTHER): Payer: Self-pay | Admitting: *Deleted

## 2020-05-06 DIAGNOSIS — R0602 Shortness of breath: Secondary | ICD-10-CM | POA: Diagnosis not present

## 2020-05-06 DIAGNOSIS — M79601 Pain in right arm: Secondary | ICD-10-CM | POA: Diagnosis not present

## 2020-05-06 DIAGNOSIS — R079 Chest pain, unspecified: Secondary | ICD-10-CM | POA: Insufficient documentation

## 2020-05-06 DIAGNOSIS — Z87891 Personal history of nicotine dependence: Secondary | ICD-10-CM | POA: Insufficient documentation

## 2020-05-06 DIAGNOSIS — M549 Dorsalgia, unspecified: Secondary | ICD-10-CM | POA: Diagnosis not present

## 2020-05-06 DIAGNOSIS — M79602 Pain in left arm: Secondary | ICD-10-CM | POA: Insufficient documentation

## 2020-05-06 LAB — TROPONIN I (HIGH SENSITIVITY)
Troponin I (High Sensitivity): <2 ng/L (ref <18)
Troponin I (High Sensitivity): <2 ng/L (ref <18)

## 2020-05-06 LAB — CBC
HCT: 36.8 % (ref 36.0–46.0)
Hemoglobin: 12.4 g/dL (ref 12.0–15.0)
MCH: 28.2 pg (ref 26.0–34.0)
MCHC: 33.7 g/dL (ref 30.0–36.0)
MCV: 83.6 fL (ref 80.0–100.0)
Platelets: 305 10*3/uL (ref 150–400)
RBC: 4.4 MIL/uL (ref 3.87–5.11)
RDW: 12.4 % (ref 11.5–15.5)
WBC: 10.6 10*3/uL — ABNORMAL HIGH (ref 4.0–10.5)
nRBC: 0 % (ref 0.0–0.2)

## 2020-05-06 LAB — HEPATIC FUNCTION PANEL
ALT: 16 U/L (ref 0–44)
AST: 15 U/L (ref 15–41)
Albumin: 4 g/dL (ref 3.5–5.0)
Alkaline Phosphatase: 46 U/L (ref 38–126)
Bilirubin, Direct: <0.1 mg/dL (ref 0.0–0.2)
Total Bilirubin: 0.3 mg/dL (ref 0.3–1.2)
Total Protein: 7 g/dL (ref 6.5–8.1)

## 2020-05-06 LAB — BASIC METABOLIC PANEL
Anion gap: 9 (ref 5–15)
BUN: 11 mg/dL (ref 6–20)
CO2: 22 mmol/L (ref 22–32)
Calcium: 9.1 mg/dL (ref 8.9–10.3)
Chloride: 105 mmol/L (ref 98–111)
Creatinine, Ser: 0.71 mg/dL (ref 0.44–1.00)
GFR, Estimated: >60 mL/min (ref >60)
Glucose, Bld: 117 mg/dL — ABNORMAL HIGH (ref 70–99)
Potassium: 4 mmol/L (ref 3.5–5.1)
Sodium: 136 mmol/L (ref 135–145)

## 2020-05-06 LAB — D-DIMER, QUANTITATIVE: D-Dimer, Quant: 0.5 ug/mL-FEU (ref 0.00–0.50)

## 2020-05-06 LAB — PREGNANCY, URINE: Preg Test, Ur: NEGATIVE

## 2020-05-06 NOTE — ED Provider Notes (Signed)
[facility] EMERGENCY DEPARTMENT Provider Note   CSN: [id number] Arrival date & time: 05/06/20  1401     History Chief Complaint  Patient presents with  . Chest Pain    Shob    Emily Howe is a 35 y.o. female.  Patient with history of MS --presents the emergency department today for evaluation of chest and back pain and swelling in her legs.  Patient states that approximately 2 days ago she developed pain in her right chest which radiates to the left side and left arm.  She has pain across her back as well.  No cough or fever.  She has shortness of breath at rest and with activity.  Chest pain is sharp at times and dull at other times.  No nausea, vomiting, diarrhea.  She denies abdominal pain.  She reports having swelling in her bilateral legs around her knees. This started yesterday.  She is taking Tylenol for pain, no other treatment.  Patient denies history of hypertension, high cholesterol, diabetes, smoking (although chart says otherwise).  She states that her father has a history of high blood pressure. Patient denies risk factors for pulmonary embolism including: unilateral leg swelling, history of DVT/PE/other blood clots, use of exogenous hormones, recent immobilizations, recent surgery, recent travel (>4hr segment), malignancy, hemoptysis.           Past Medical History:  Diagnosis Date  . Bipolar 1 disorder (HCC)   . Blurry vision   . Chronic migraine   . Depression   . Memory loss   . MS (multiple sclerosis) (HCC)   . Numbness   . Vertigo     There are no problems to display for this patient.   Past Surgical History:  Procedure Laterality Date  . APPENDECTOMY    . CHOLECYSTECTOMY    . KNEE SURGERY    . TONSILLECTOMY    . TUBAL LIGATION       OB History   No obstetric history on file.     Family History  Problem Relation Age of Onset  . Diabetes Other   . Hypertension Other   . Cancer Other     Social History   Tobacco Use  .  Smoking status: Former Smoker    Packs/day: 0.00    Quit date: 08/20/2015    Years since quitting: 4.7  . Smokeless tobacco: Current User  Vaping Use  . Vaping Use: Never used  Substance Use Topics  . Alcohol use: No  . Drug use: Yes    Types: Marijuana    Home Medications Prior to Admission medications   Medication Sig Start Date End Date Taking? Authorizing Provider  acyclovir (ZOVIRAX) 200 MG capsule Take 200 mg by mouth 5 (five) times daily.    [provider]  ibuprofen (ADVIL,MOTRIN) 800 MG tablet Take 1 tablet (800 mg total) by mouth every 8 (eight) hours as needed. 05/12/17   Long, Arlyss Repress, MD  lithium 300 MG tablet Take 300 mg by mouth 3 (three) times daily.    [provider]  LORazepam (ATIVAN) 1 MG tablet Take 1 tablet (1 mg total) by mouth 3 (three) times daily as needed for up to 9 doses for anxiety. 12/03/17   Azalia Bilis, MD  omeprazole (PRILOSEC) 20 MG capsule Take 1 capsule (20 mg total) by mouth 2 (two) times daily. 12/28/16   Rolland Porter, MD  ondansetron (ZOFRAN ODT) 4 MG disintegrating tablet 4mg  ODT q4 hours prn nausea/vomit 01/13/18   13/5/19,  DO  Peginterferon Beta-1a (PLEGRIDY [place]) Inject 160 mg into the skin.    [provider]  potassium chloride SA (K-DUR,KLOR-CON) 20 MEQ tablet Take 2 tablets (40 mEq total) by mouth daily for 3 days. 12/02/17 12/05/17  Aviva Kluver B, PA-C  propranolol (INDERAL) 10 MG tablet Take 10 mg by mouth every evening.     [provider]  traMADol (ULTRAM) 50 MG tablet Take 1 tablet (50 mg total) by mouth every 6 (six) hours as needed. 05/12/17   Long, Arlyss Repress, MD  traZODone (DESYREL) 50 MG tablet Take 1 tablet (50 mg total) by mouth at bedtime. 12/28/16   Rolland Porter, MD    Allergies    Lamotrigine, Valproate sodium, Cefdinir, Doxycycline, Reglan [metoclopramide], and Nitrofurantoin  Review of Systems   Review of Systems  Constitutional: Negative for fever.  HENT: Negative for rhinorrhea and  sore throat.   Eyes: Negative for redness.  Respiratory: Positive for shortness of breath. Negative for cough.   Cardiovascular: Positive for chest pain and leg swelling.  Gastrointestinal: Negative for abdominal pain, diarrhea, nausea and vomiting.  Genitourinary: Negative for dysuria, frequency, hematuria and urgency.  Musculoskeletal: Negative for myalgias.  Skin: Negative for rash.  Neurological: Negative for headaches.    Physical Exam Updated Vital Signs BP 122/70   Pulse 74   Temp 98.8 F (37.1 C) (Oral)   Resp 20   LMP 05/06/2020   SpO2 98%   Physical Exam Vitals and nursing note reviewed.  Constitutional:      Appearance: She is well-developed and well-nourished. She is not diaphoretic.  HENT:     Head: Normocephalic and atraumatic.     Mouth/Throat:     Mouth: Mucous membranes are normal. Mucous membranes are not dry.  Eyes:     Conjunctiva/sclera: Conjunctivae normal.  Neck:     Vascular: Normal carotid pulses. No carotid bruit or JVD.     Trachea: Trachea normal. No tracheal deviation.  Cardiovascular:     Rate and Rhythm: Normal rate and regular rhythm.     Pulses: Intact distal pulses. No decreased pulses.     Heart sounds: Normal heart sounds, S1 normal and S2 normal. No murmur heard.   Pulmonary:     Effort: Pulmonary effort is normal. No respiratory distress.     Breath sounds: No wheezing.  Chest:     Chest wall: No tenderness.  Abdominal:     General: Bowel sounds are normal. Aorta is normal.     Palpations: Abdomen is soft.     Tenderness: There is no abdominal tenderness. There is no guarding or rebound.  Musculoskeletal:        General: Normal range of motion.     Cervical back: Normal range of motion and neck supple. No muscular tenderness.  Skin:    General: Skin is warm and dry.     Coloration: Skin is not pale.     Nails: There is no cyanosis.  Neurological:     Mental Status: She is alert.  Psychiatric:        Mood and Affect: Mood  and affect normal.     ED Results / Procedures / Treatments   Labs (all labs ordered are listed, but only abnormal results are displayed) Labs Reviewed  BASIC METABOLIC PANEL - Abnormal; Notable for the following components:      Result Value   Glucose, Bld 117 (*)    All other components within normal limits  CBC - Abnormal; Notable  for the following components:   WBC 10.6 (*)    All other components within normal limits  PREGNANCY, URINE  HEPATIC FUNCTION PANEL  D-DIMER, QUANTITATIVE  TROPONIN I (HIGH SENSITIVITY)  TROPONIN I (HIGH SENSITIVITY)    ED ECG REPORT   Date: 05/06/2020  Rate: 69  Rhythm: normal sinus rhythm  QRS Axis: left  Intervals: normal  ST/T Wave abnormalities: nonspecific T wave changes  Conduction Disutrbances:none  Narrative Interpretation: [id number] pattern noted but present 2019  Old EKG Reviewed: unchanged from 2019  I have personally reviewed the EKG tracing and agree with the computerized printout as noted.   Radiology DG Chest 2 View  Result Date: 05/06/2020 CLINICAL DATA:  Left-sided chest pain and shortness of breath for 2 hours. EXAM: CHEST - 2 VIEW COMPARISON:  01/13/2018 FINDINGS: The heart size and mediastinal contours are within normal limits. Both lungs are clear. The visualized skeletal structures are unremarkable. IMPRESSION: No active cardiopulmonary disease. Electronically Signed   By: Danae Orleans M.D.   On: 05/06/2020 14:50    Procedures Procedures   Medications Ordered in ED Medications - No data to display  ED Course  I have reviewed the triage vital signs and the nursing notes.  Pertinent labs & imaging results that were available during my care of the patient were reviewed by me and considered in my medical decision making (see chart for details).  Patient seen and examined. Her exam is unremarkable. Work-up initiated. Discussed d-dimer test -- pt would like to proceed. Aware of need for CT if positive.   Vital signs  reviewed and are as follows: BP 122/70   Pulse 74   Temp 98.8 F (37.1 C) (Oral)   Resp 20   LMP 05/06/2020   SpO2 98%   4:33 PM Pt rechecked, stable. D-dimer nml, first trop neg, EKG unchanged. Pt ambulated and did not desat. Awaiting 2nd trop. Will likely be stable for d/c with PCP follow-up.   2nd troponin neg. Pt updated.   Patient was counseled to return with severe chest pain, especially if the pain is crushing or pressure-like and spreads to the arms, back, neck, or jaw, or if they have sweating, nausea, or shortness of breath with the pain. They were encouraged to call 911 with these symptoms.   They were also told to return if their chest pain gets worse and does not go away with rest, they have an attack of chest pain lasting longer than usual despite rest and treatment with the medications their caregiver has prescribed, if they wake from sleep with chest pain or shortness of breath, if they feel dizzy or faint, if they have chest pain not typical of their usual pain, or if they have any other emergent concerns regarding their health.  The patient verbalized understanding and agreed.     MDM Rules/Calculators/A&P                          Pt here with CP, SOB, bilateral leg pain.  D-dimer was normal and I have low concern for PE given no tachycardia, hypoxia.  Chest x-ray is clear without signs of pneumonia or fluid overload.  Troponin negative x2 and EKG without any changes.  Patient feels like her legs are swollen however no signs of cellulitis or significant lower extremity edema.  No signs of liver dysfunction, renal failure, or heart failure tonight.  Encouraged PCP follow-up.  No dangerous or life-threatening conditions suspected or identified  by history, physical exam, and by work-up. No indications for hospitalization identified.    Final Clinical Impression(s) / ED Diagnoses Final diagnoses:  Chest pain, unspecified type  Shortness of breath    Rx / DC Orders ED  Discharge Orders    None       Renne Crigler, PA-C 05/06/20 2214    Melene Plan, DO 05/06/20 2214

## 2020-05-06 NOTE — ED Notes (Signed)
Ambulation with SpO2, started at 97% and 77HR, then while walking 99% steady and 87 HR.  Pt. Stated her legs hurt, but not SOB.

## 2020-05-06 NOTE — Discharge Instructions (Signed)
Please read and follow all provided instructions.  Your diagnoses today include:  1. Chest pain, unspecified type   2. Shortness of breath     Tests performed today include:  An EKG of your heart  A chest x-ray  Cardiac enzymes - a blood test for heart muscle damage, no problems  Blood counts and electrolytes  Screening test for blood clot - was normal  Vital signs. See below for your results today.   Medications prescribed:  Please use over-the-counter NSAID medications (ibuprofen, naproxen) as directed on the packaging for pain if you do not have any reasons not to take these medications just as weak kidneys or a history of bleeding in your stomach or gut.   Take any prescribed medications only as directed.  Follow-up instructions: Please follow-up with your primary care provider as soon as you can for further evaluation of your symptoms.   Return instructions:  SEEK IMMEDIATE MEDICAL ATTENTION IF:  You have severe chest pain, especially if the pain is crushing or pressure-like and spreads to the arms, back, neck, or jaw, or if you have sweating, nausea (feeling sick to your stomach), or shortness of breath. THIS IS AN EMERGENCY. Don't wait to see if the pain will go away. Get medical help at once. Call 911 or 0 (operator). DO NOT drive yourself to the hospital.   Your chest pain gets worse and does not go away with rest.   You have an attack of chest pain lasting longer than usual, despite rest and treatment with the medications your caregiver has prescribed.   You wake from sleep with chest pain or shortness of breath.  You feel dizzy or faint.  You have chest pain not typical of your usual pain for which you originally saw your caregiver.   You have any other emergent concerns regarding your health.  Additional Information: Chest pain comes from many different causes. Your caregiver has diagnosed you as having chest pain that is not specific for one problem, but  does not require admission.  You are at low risk for an acute heart condition or other serious illness.   Your vital signs today were: BP 104/60   Pulse 60   Temp 98.8 F (37.1 C) (Oral)   Resp (!) 24   LMP 05/06/2020   SpO2 100%  If your blood pressure (BP) was elevated above 135/85 this visit, please have this repeated by your doctor within one month. --------------

## 2020-05-06 NOTE — ED Triage Notes (Signed)
Pt reports chest pain since yesterday "constant aching and sometimes sharp". Also states legs have been swelling and she is shob with exertion

## 2021-03-13 ENCOUNTER — Other Ambulatory Visit: Payer: Self-pay

## 2021-03-13 ENCOUNTER — Encounter (HOSPITAL_BASED_OUTPATIENT_CLINIC_OR_DEPARTMENT_OTHER): Payer: Self-pay

## 2021-03-13 ENCOUNTER — Emergency Department (HOSPITAL_BASED_OUTPATIENT_CLINIC_OR_DEPARTMENT_OTHER)
Admission: EM | Admit: 2021-03-13 | Discharge: 2021-03-13 | Disposition: A | Discharge status: Home / Self Care | Payer: 59 | Attending: Emergency Medicine | Admitting: Emergency Medicine

## 2021-03-13 DIAGNOSIS — Z20822 Contact with and (suspected) exposure to covid-19: Secondary | ICD-10-CM | POA: Diagnosis not present

## 2021-03-13 DIAGNOSIS — R1084 Generalized abdominal pain: Secondary | ICD-10-CM | POA: Diagnosis not present

## 2021-03-13 DIAGNOSIS — R519 Headache, unspecified: Secondary | ICD-10-CM | POA: Diagnosis not present

## 2021-03-13 DIAGNOSIS — R509 Fever, unspecified: Secondary | ICD-10-CM | POA: Insufficient documentation

## 2021-03-13 DIAGNOSIS — R109 Unspecified abdominal pain: Secondary | ICD-10-CM | POA: Diagnosis present

## 2021-03-13 DIAGNOSIS — R112 Nausea with vomiting, unspecified: Secondary | ICD-10-CM | POA: Insufficient documentation

## 2021-03-13 LAB — CBC
HCT: 33.6 % — ABNORMAL LOW (ref 36.0–46.0)
Hemoglobin: 11 g/dL — ABNORMAL LOW (ref 12.0–15.0)
MCH: 26.8 pg (ref 26.0–34.0)
MCHC: 32.7 g/dL (ref 30.0–36.0)
MCV: 82 fL (ref 80.0–100.0)
Platelets: 404 10*3/uL — ABNORMAL HIGH (ref 150–400)
RBC: 4.1 MIL/uL (ref 3.87–5.11)
RDW: 13.6 % (ref 11.5–15.5)
WBC: 12.9 10*3/uL — ABNORMAL HIGH (ref 4.0–10.5)
nRBC: 0 % (ref 0.0–0.2)

## 2021-03-13 LAB — URINALYSIS, ROUTINE W REFLEX MICROSCOPIC
Bilirubin Urine: NEGATIVE
Glucose, UA: NEGATIVE mg/dL
Ketones, ur: NEGATIVE mg/dL
Leukocytes,Ua: NEGATIVE
Nitrite: NEGATIVE
Protein, ur: 30 mg/dL — AB
Specific Gravity, Urine: 1.015 (ref 1.005–1.030)
pH: 5.5 (ref 5.0–8.0)

## 2021-03-13 LAB — COMPREHENSIVE METABOLIC PANEL
ALT: 18 U/L (ref 0–44)
AST: 16 U/L (ref 15–41)
Albumin: 3.5 g/dL (ref 3.5–5.0)
Alkaline Phosphatase: 64 U/L (ref 38–126)
Anion gap: 12 (ref 5–15)
BUN: 18 mg/dL (ref 6–20)
CO2: 22 mmol/L (ref 22–32)
Calcium: 9.2 mg/dL (ref 8.9–10.3)
Chloride: 103 mmol/L (ref 98–111)
Creatinine, Ser: 1.29 mg/dL — ABNORMAL HIGH (ref 0.44–1.00)
GFR, Estimated: 56 mL/min — ABNORMAL LOW (ref >60)
Glucose, Bld: 112 mg/dL — ABNORMAL HIGH (ref 70–99)
Potassium: 3.4 mmol/L — ABNORMAL LOW (ref 3.5–5.1)
Sodium: 137 mmol/L (ref 135–145)
Total Bilirubin: 0.4 mg/dL (ref 0.3–1.2)
Total Protein: 8 g/dL (ref 6.5–8.1)

## 2021-03-13 LAB — RESP PANEL BY RT-PCR (FLU A&B, COVID) ARPGX2
Influenza A by PCR: NEGATIVE
Influenza B by PCR: NEGATIVE
SARS Coronavirus 2 by RT PCR: NEGATIVE

## 2021-03-13 LAB — URINALYSIS, MICROSCOPIC (REFLEX)

## 2021-03-13 LAB — LIPASE, BLOOD: Lipase: 25 U/L (ref 11–51)

## 2021-03-13 LAB — PREGNANCY, URINE: Preg Test, Ur: NEGATIVE

## 2021-03-13 MED ORDER — DICYCLOMINE HCL 10 MG/ML IM SOLN
20.0000 mg | Freq: Once | INTRAMUSCULAR | Status: AC
  Administered 2021-03-13: 20 mg via INTRAMUSCULAR
  Filled 2021-03-13: qty 2

## 2021-03-13 MED ORDER — DICYCLOMINE HCL 20 MG PO TABS: 20.0000 mg | ORAL_TABLET | Freq: Two times a day (BID) | ORAL | 0 refills | Status: AC

## 2021-03-13 MED ORDER — IBUPROFEN 800 MG PO TABS
800.0000 mg | ORAL_TABLET | Freq: Once | ORAL | Status: AC
  Administered 2021-03-13: 800 mg via ORAL
  Filled 2021-03-13: qty 1

## 2021-03-13 MED ORDER — SODIUM CHLORIDE 0.9 % IV BOLUS
1000.0000 mL | Freq: Once | INTRAVENOUS | Status: AC
  Administered 2021-03-13: 1000 mL via INTRAVENOUS

## 2021-03-13 MED ORDER — MORPHINE SULFATE (PF) 2 MG/ML IV SOLN
2.0000 mg | Freq: Once | INTRAVENOUS | Status: DC
Start: 2021-03-13 — End: 2021-03-13

## 2021-03-13 MED ORDER — ONDANSETRON HCL 4 MG/2ML IJ SOLN
4.0000 mg | Freq: Once | INTRAMUSCULAR | Status: AC
  Administered 2021-03-13: 4 mg via INTRAVENOUS
  Filled 2021-03-13: qty 2

## 2021-03-13 NOTE — ED Provider Notes (Signed)
[facility] EMERGENCY DEPARTMENT Provider Note   CSN: [id number] Arrival date & time: 03/13/21  1348     History  Chief Complaint  Patient presents with   Abdominal Pain    Emily Howe is a 36 y.o. female with a past medical history significant for multiple sclerosis, chronic migraines, bipolar, depression, with surgical history that is relevant for cholecystectomy, as well as appendectomy, as well as tubal ligation who presents with complaints of abdominal pain, nausea, vomiting, headache, fever for the last week.  Patient denies any blood in vomit, denies any diarrhea, constipation.  Patient reports that her significant other was also sick a couple of weeks ago with similar symptoms.  Patient denies any other known sick contacts.  Patient reports that she has not gotten her flu shot this year.  Patient reports that her pain is generalized over the abdomen there is not one particular place that hurts.  Patient denies dysuria, urinary frequency, hematuria.  She denies vaginal discharge, pain with sex, vaginal bleeding.  Patient does report that she has had a little bit of cough, and sore throat on and off as well.  Patient reports that her fever chills are usually at night.  Not taking anything for her symptoms, reports that she had not previously been able to take ibuprofen due to medication interactions.  Patient denies any blurry vision, slurred speech, unilateral weakness or numbness with her headache.  She does endorse some jaw pain with her headache.   Abdominal Pain Associated symptoms: chills and fever       Home Medications Prior to Admission medications   Medication Sig Start Date End Date Taking? Authorizing Provider  acyclovir (ZOVIRAX) 200 MG capsule Take 200 mg by mouth 5 (five) times daily.    [provider]  ibuprofen (ADVIL,MOTRIN) 800 MG tablet Take 1 tablet (800 mg total) by mouth every 8 (eight) hours as needed. 05/12/17   Long, Wonda Olds, MD  lithium  300 MG tablet Take 300 mg by mouth 3 (three) times daily.    [provider]  LORazepam (ATIVAN) 1 MG tablet Take 1 tablet (1 mg total) by mouth 3 (three) times daily as needed for up to 9 doses for anxiety. 12/03/17   Jola Schmidt, MD  omeprazole (PRILOSEC) 20 MG capsule Take 1 capsule (20 mg total) by mouth 2 (two) times daily. 12/28/16   Tanna Furry, MD  ondansetron (ZOFRAN ODT) 4 MG disintegrating tablet 4mg  ODT q4 hours prn nausea/vomit 01/13/18   Deno Etienne, DO  Peginterferon Beta-1a (PLEGRIDY [place]) Inject 160 mg into the skin.    [provider]  potassium chloride SA (K-DUR,KLOR-CON) 20 MEQ tablet Take 2 tablets (40 mEq total) by mouth daily for 3 days. 12/02/17 12/05/17  Langston Masker B, PA-C  propranolol (INDERAL) 10 MG tablet Take 10 mg by mouth every evening.     [provider]  traMADol (ULTRAM) 50 MG tablet Take 1 tablet (50 mg total) by mouth every 6 (six) hours as needed. 05/12/17   Long, Wonda Olds, MD  traZODone (DESYREL) 50 MG tablet Take 1 tablet (50 mg total) by mouth at bedtime. 12/28/16   Tanna Furry, MD      Allergies    Lamotrigine, Valproate sodium, Cefdinir, Doxycycline, Reglan [metoclopramide], and Nitrofurantoin    Review of Systems   Review of Systems  Constitutional:  Positive for chills and fever.  Gastrointestinal:  Positive for abdominal pain.  Neurological:  Positive for headaches.  All other systems reviewed  and are negative.  Physical Exam Updated Vital Signs BP 129/75 (BP Location: Right Arm)    Pulse 76    Temp 98.1 F (36.7 C) (Oral)    Resp 14    Ht 5\' 2"  (1.575 m)    Wt 97.1 kg    LMP 03/13/2021    SpO2 100%    BMI 39.14 kg/m  Physical Exam Vitals and nursing note reviewed.  Constitutional:      General: She is not in acute distress.    Appearance: Normal appearance.  HENT:     Head: Normocephalic and atraumatic.  Eyes:     General:        Right eye: No discharge.        Left eye: No discharge.  Cardiovascular:      Rate and Rhythm: Normal rate and regular rhythm.     Heart sounds: No murmur heard.   No friction rub. No gallop.  Pulmonary:     Effort: Pulmonary effort is normal.     Breath sounds: Normal breath sounds.  Abdominal:     General: Bowel sounds are normal.     Palpations: Abdomen is soft.     Comments: Patient has generalized tenderness throughout the abdomen, less prevalent on the flanks.  She has no rebound, rigidity, guarding.  There is no focal area of tenderness or masses palpated throughout the abdomen.  There is no evidence of rash or ecchymosis on the abdomen.  Skin:    General: Skin is warm and dry.     Capillary Refill: Capillary refill takes less than 2 seconds.  Neurological:     Mental Status: She is alert and oriented to person, place, and time.  Psychiatric:        Mood and Affect: Mood normal.        Behavior: Behavior normal.    ED Results / Procedures / Treatments   Labs (all labs ordered are listed, but only abnormal results are displayed) Labs Reviewed  COMPREHENSIVE METABOLIC PANEL - Abnormal; Notable for the following components:      Result Value   Potassium 3.4 (*)    Glucose, Bld 112 (*)    Creatinine, Ser 1.29 (*)    GFR, Estimated 56 (*)    All other components within normal limits  CBC - Abnormal; Notable for the following components:   WBC 12.9 (*)    Hemoglobin 11.0 (*)    HCT 33.6 (*)    Platelets 404 (*)    All other components within normal limits  URINALYSIS, ROUTINE W REFLEX MICROSCOPIC - Abnormal; Notable for the following components:   Hgb urine dipstick SMALL (*)    Protein, ur 30 (*)    All other components within normal limits  URINALYSIS, MICROSCOPIC (REFLEX) - Abnormal; Notable for the following components:   Bacteria, UA RARE (*)    All other components within normal limits  RESP PANEL BY RT-PCR (FLU A&B, COVID) ARPGX2  LIPASE, BLOOD  PREGNANCY, URINE    EKG None  Radiology No results found.  Procedures Procedures     Medications Ordered in ED Medications  sodium chloride 0.9 % bolus 1,000 mL (has no administration in time range)  ondansetron (ZOFRAN) injection 4 mg (has no administration in time range)  dicyclomine (BENTYL) injection 20 mg (has no administration in time range)  ibuprofen (ADVIL) tablet 800 mg (800 mg Oral Given 03/13/21 1954)    ED Course/ Medical Decision Making/ A&P  Medical Decision Making  This patient presents to the ED for concern of abdominal pain, nausea vomiting, fever, headache, fatigue, this involves an extensive number of treatment options, and is a complaint that carries with it a high risk of complications and morbidity. The emergent differential diagnosis includes, but is not limited to, upper respiratory infectious illness such as COVID, flu, isolated GI bug, gastritis, urinary tract infection, pancreatitis, less concerned for acute mesenteric ischemia, small bowel obstruction, acute abdomen, or surgical abdomen.   Co morbidities that complicate the patient evaluation: Obesity, previous intra-abdominal surgeries including cholecystectomy, appendectomy, tubal ligation  Additional history obtained from significant other External records from outside source obtained and reviewed including ED visits  Physical exam performed. The pertinent findings include: Generalized tenderness of the abdomen without any focal findings.  No rebound, rigidity, guarding, minimal clinical concern for acute peritonitis, other acute abdomen, or surgical abdomen at this time.  Lab Tests: I Ordered, and personally interpreted labs.  The pertinent results include: Moderate leukocytosis at 12.9, moderate anemia of 11.0.  Patient also has a CMP which shows slightly decreased potassium of 3.4, slightly elevated glucose at 112, and elevated creatinine of 1.29, up from baseline of 0.71 10 months ago.  Unclear with the interval change has been at this time, as we do not have a  recent baseline is difficult to assess whether this represents an acute kidney injury.  Patient does appear clinically dehydrated, I do recommend that we give her a liter of fluids while we continue her work-up.  Her urinalysis shows small hemoglobin, as well as protein with rare bacteria, some white blood cell clumps and hyaline casts.  I think this is not representative of a urinary tract infection, however it does lend some evidence for overall dehydration state.  We will add a COVID and flu respiratory panel at this time.  RVP negative for COVID, flu.  Given overall clinical picture I recommend IV fluid hydration, will treat pain, and nausea, as well as stomach pain with ibuprofen, Zofran, Bentyl.  On reevaluation patient still pending fluids, as well as Zofran, Bentyl.  8:10 PM Care of Emily Howe transferred to [facility] and Dr. Ronnald Nian at the end of my shift as the patient will require reassessment once labs/imaging have resulted. Patient presentation, ED course, and plan of care discussed with review of all pertinent labs and imaging. Please see his/her note for further details regarding further ED course and disposition. Plan at time of handoff is reevaluate after fluids, and medication for stomach pain, upset, with plan to discharge with presumptive diagnosis of viral gastroenteritis and close follow-up with primary care, and strict return precautions. This may be altered or completely changed at the discretion of the oncoming team pending results of further workup.  Final Clinical Impression(s) / ED Diagnoses Final diagnoses:  None    Rx / DC Orders ED Discharge Orders     None         Anselmo Pickler, PA-C 03/13/21 2010    Lennice Sites, DO 03/13/21 2137

## 2021-03-13 NOTE — ED Notes (Signed)
Pt provided discharge instructions and prescription information. Pt was given the opportunity to ask questions and questions were answered. Discharge signature not obtained in the setting of the COVID-19 pandemic in order to reduce high touch surfaces.  ° °

## 2021-03-13 NOTE — ED Triage Notes (Addendum)
Pt c/o abd pain, n/v, HA, fever x 1 week-NAD-steady gait

## 2021-03-13 NOTE — ED Notes (Signed)
IV attempt x 1 without success.

## 2021-03-13 NOTE — ED Notes (Signed)
Patient tolerating PO challenge.  States she feels much better.

## 2021-03-13 NOTE — ED Provider Notes (Signed)
10:23 PM signout from [place] PA-C at shift change.  Patient here for evaluation of abdominal pain.  Work-up reassuring.  Patient updated on results prior.  Her creatinine was slightly elevated.  Currently receiving 1 L saline bolus prior to discharge.  Patient was evaluated by myself.  She states that she is feeling better.  Will give prescription for Bentyl.  The patient was urged to return to the Emergency Department immediately with worsening of current symptoms, worsening abdominal pain, persistent vomiting, blood noted in stools, fever, or any other concerns. The patient verbalized understanding.   BP 124/66    Pulse 73    Temp 98.1 F (36.7 C) (Oral)    Resp 16    Ht 5\' 2"  (1.575 m)    Wt 97.1 kg    LMP 03/13/2021    SpO2 99%    BMI 39.14 kg/m      Carlisle Cater, PA-C 03/13/21 [place], [place], DO 03/13/21 2240

## 2021-03-13 NOTE — Discharge Instructions (Signed)
Please read and follow all provided instructions.  Your diagnoses today include:  1. Generalized abdominal pain     Tests performed today include: Blood cell counts and platelets: slightly high white blood cell count Kidney and liver function tests: slightly weak kidney function Pancreas function test (called lipase) Urine test to look for infection A blood or urine test for pregnancy (women only) Vital signs. See below for your results today.   Medications prescribed:  Bentyl - medication for intestinal cramps and spasms  Take any prescribed medications only as directed.  Home care instructions:  Follow any educational materials contained in this packet.  Follow-up instructions: Please follow-up with your primary care provider in the next 3 days for further evaluation of your symptoms.    Return instructions:  SEEK IMMEDIATE MEDICAL ATTENTION IF: The pain does not go away or becomes severe  A temperature above 101F develops  Repeated vomiting occurs (multiple episodes)  The pain becomes localized to portions of the abdomen. The right side could possibly be appendicitis. In an adult, the left lower portion of the abdomen could be colitis or diverticulitis.  Blood is being passed in stools or vomit (bright red or black tarry stools)  You develop chest pain, difficulty breathing, dizziness or fainting, or become confused, poorly responsive, or inconsolable (young children) If you have any other emergent concerns regarding your health  Additional Information: Abdominal (belly) pain can be caused by many things. Your caregiver performed an examination and possibly ordered blood/urine tests and imaging (CT scan, x-rays, ultrasound). Many cases can be observed and treated at home after initial evaluation in the emergency department. Even though you are being discharged home, abdominal pain can be unpredictable. Therefore, you need a repeated exam if your pain does not resolve, returns,  or worsens. Most patients with abdominal pain don't have to be admitted to the hospital or have surgery, but serious problems like appendicitis and gallbladder attacks can start out as nonspecific pain. Many abdominal conditions cannot be diagnosed in one visit, so follow-up evaluations are very important.  Your vital signs today were: BP 124/66    Pulse 73    Temp 98.1 F (36.7 C) (Oral)    Resp 16    Ht 5\' 2"  (1.575 m)    Wt 97.1 kg    LMP 03/13/2021    SpO2 99%    BMI 39.14 kg/m  If your blood pressure (bp) was elevated above 135/85 this visit, please have this repeated by your doctor within one month. --------------

## 2021-06-18 ENCOUNTER — Emergency Department (HOSPITAL_BASED_OUTPATIENT_CLINIC_OR_DEPARTMENT_OTHER)
Admission: EM | Admit: 2021-06-18 | Discharge: 2021-06-18 | Disposition: A | Discharge status: Home / Self Care | Payer: 59 | Attending: Emergency Medicine | Admitting: Emergency Medicine

## 2021-06-18 ENCOUNTER — Encounter (HOSPITAL_BASED_OUTPATIENT_CLINIC_OR_DEPARTMENT_OTHER): Payer: Self-pay | Admitting: Emergency Medicine

## 2021-06-18 ENCOUNTER — Emergency Department (HOSPITAL_BASED_OUTPATIENT_CLINIC_OR_DEPARTMENT_OTHER): Payer: 59

## 2021-06-18 ENCOUNTER — Other Ambulatory Visit: Payer: Self-pay

## 2021-06-18 DIAGNOSIS — R300 Dysuria: Secondary | ICD-10-CM | POA: Diagnosis not present

## 2021-06-18 DIAGNOSIS — R1084 Generalized abdominal pain: Secondary | ICD-10-CM | POA: Diagnosis present

## 2021-06-18 DIAGNOSIS — K529 Noninfective gastroenteritis and colitis, unspecified: Secondary | ICD-10-CM | POA: Insufficient documentation

## 2021-06-18 DIAGNOSIS — R195 Other fecal abnormalities: Secondary | ICD-10-CM | POA: Insufficient documentation

## 2021-06-18 LAB — URINALYSIS, ROUTINE W REFLEX MICROSCOPIC
Bilirubin Urine: NEGATIVE
Glucose, UA: NEGATIVE mg/dL
Hgb urine dipstick: NEGATIVE
Ketones, ur: NEGATIVE mg/dL
Leukocytes,Ua: NEGATIVE
Nitrite: NEGATIVE
Protein, ur: NEGATIVE mg/dL
Specific Gravity, Urine: 1.01 (ref 1.005–1.030)
pH: 6 (ref 5.0–8.0)

## 2021-06-18 LAB — COMPREHENSIVE METABOLIC PANEL
ALT: 13 U/L (ref 0–44)
AST: 18 U/L (ref 15–41)
Albumin: 4.3 g/dL (ref 3.5–5.0)
Alkaline Phosphatase: 47 U/L (ref 38–126)
Anion gap: 9 (ref 5–15)
BUN: 13 mg/dL (ref 6–20)
CO2: 23 mmol/L (ref 22–32)
Calcium: 9.4 mg/dL (ref 8.9–10.3)
Chloride: 107 mmol/L (ref 98–111)
Creatinine, Ser: 0.81 mg/dL (ref 0.44–1.00)
GFR, Estimated: >60 mL/min (ref >60)
Glucose, Bld: 86 mg/dL (ref 70–99)
Potassium: 3.5 mmol/L (ref 3.5–5.1)
Sodium: 139 mmol/L (ref 135–145)
Total Bilirubin: 0.6 mg/dL (ref 0.3–1.2)
Total Protein: 7.6 g/dL (ref 6.5–8.1)

## 2021-06-18 LAB — CBC
HCT: 38.1 % (ref 36.0–46.0)
Hemoglobin: 12.2 g/dL (ref 12.0–15.0)
MCH: 27.6 pg (ref 26.0–34.0)
MCHC: 32 g/dL (ref 30.0–36.0)
MCV: 86.2 fL (ref 80.0–100.0)
Platelets: 316 10*3/uL (ref 150–400)
RBC: 4.42 MIL/uL (ref 3.87–5.11)
RDW: 13.6 % (ref 11.5–15.5)
WBC: 9.1 10*3/uL (ref 4.0–10.5)
nRBC: 0 % (ref 0.0–0.2)

## 2021-06-18 LAB — PREGNANCY, URINE: Preg Test, Ur: NEGATIVE

## 2021-06-18 LAB — LIPASE, BLOOD: Lipase: 37 U/L (ref 11–51)

## 2021-06-18 MED ORDER — IOHEXOL 300 MG/ML  SOLN
100.0000 mL | Freq: Once | INTRAMUSCULAR | Status: AC | PRN
  Administered 2021-06-18: 100 mL via INTRAVENOUS

## 2021-06-18 MED ORDER — ALUM & MAG HYDROXIDE-SIMETH 200-200-20 MG/5ML PO SUSP
30.0000 mL | Freq: Once | ORAL | Status: AC
  Administered 2021-06-18: 30 mL via ORAL
  Filled 2021-06-18: qty 30

## 2021-06-18 MED ORDER — DICYCLOMINE HCL 10 MG PO CAPS
10.0000 mg | ORAL_CAPSULE | Freq: Once | ORAL | Status: AC
  Administered 2021-06-18: 10 mg via ORAL
  Filled 2021-06-18: qty 1

## 2021-06-18 MED ORDER — DICYCLOMINE HCL 20 MG PO TABS: 20.0000 mg | ORAL_TABLET | Freq: Two times a day (BID) | ORAL | 0 refills | Status: AC

## 2021-06-18 NOTE — ED Notes (Signed)
MSE waiver explained. Pt verbalizes understanding. Signature pad not functioning  ?

## 2021-06-18 NOTE — Discharge Instructions (Addendum)
All of your labs and CT scan were very reassuring today.  The CT scan did not find any abnormalities aside from inflammation of your colon, consistent with a colitis.  Typically these run its course in about a week, however yours does seem to be lingering for quite some time.  I have provided you a GI referral that you can call in the morning to have a follow-up appointment with.  Since he had some relief with the Bentyl, I have sent in a prescription of this for you.  You can also pick up Maalox over-the-counter. ?

## 2021-06-18 NOTE — ED Triage Notes (Signed)
Pt arrives pov, steady gait with c/o abdominal pain with nausea and diarrhea x 2 weeks, bloody stool today. Pt also endorses upper back pain since yesterday. Reports dysuria ?

## 2021-06-18 NOTE — ED Provider Notes (Signed)
?[facility] EMERGENCY DEPARTMENT ?Provider Note ? ? ?CSN: [id number] ?Arrival date & time: 06/18/21  1625 ? ?  ? ?History ? ?Chief Complaint  ?Patient presents with  ? Abdominal Pain  ? ? ?Emily Howe is a 36 y.o. female who presents to the ED today for evaluation of diarrhea x2-week with diffuse abdominal pain and 1 episode of bloody stools earlier today.  Patient has been rehydrating appropriately but states that she is unable to take a lot of medications due to interactions with her lithium.  Abdominal pain is nonlocalized, and she denies fever, chills.  She endorses mild dysuria but denies hematuria and difficulty with stream.  Patient initially thought that her symptoms were due to a viral gastroenteritis that seems to spread around, but is concerned that her symptoms have not lasted 2 weeks.  Denies chest pain, shortness of breath.  She has no other complaints. ? ? ?Abdominal Pain ? ?  ? ?Home Medications ?Prior to Admission medications   ?Medication Sig Start Date End Date Taking? Authorizing Provider  ?dicyclomine (BENTYL) 20 MG tablet Take 1 tablet (20 mg total) by mouth 2 (two) times daily. 06/18/21  Yes Raynald Blend R, PA-C  ?acyclovir (ZOVIRAX) 200 MG capsule Take 200 mg by mouth 5 (five) times daily.    [provider]  ?dicyclomine (BENTYL) 20 MG tablet Take 1 tablet (20 mg total) by mouth 2 (two) times daily. 03/13/21   Renne Crigler, PA-C  ?ibuprofen (ADVIL,MOTRIN) 800 MG tablet Take 1 tablet (800 mg total) by mouth every 8 (eight) hours as needed. 05/12/17   Long, Arlyss Repress, MD  ?lithium 300 MG tablet Take 300 mg by mouth 3 (three) times daily.    [provider]  ?LORazepam (ATIVAN) 1 MG tablet Take 1 tablet (1 mg total) by mouth 3 (three) times daily as needed for up to 9 doses for anxiety. 12/03/17   Azalia Bilis, MD  ?omeprazole (PRILOSEC) 20 MG capsule Take 1 capsule (20 mg total) by mouth 2 (two) times daily. 12/28/16   Rolland Porter, MD  ?ondansetron (ZOFRAN ODT) 4 MG  disintegrating tablet 4mg  ODT q4 hours prn nausea/vomit 01/13/18   Melene Plan, DO  ?Peginterferon Beta-1a (PLEGRIDY [place]) Inject 160 mg into the skin.    [provider]  ?potassium chloride SA (K-DUR,KLOR-CON) 20 MEQ tablet Take 2 tablets (40 mEq total) by mouth daily for 3 days. 12/02/17 12/05/17  Aviva Kluver B, PA-C  ?propranolol (INDERAL) 10 MG tablet Take 10 mg by mouth every evening.     [provider]  ?traMADol (ULTRAM) 50 MG tablet Take 1 tablet (50 mg total) by mouth every 6 (six) hours as needed. 05/12/17   Long, Arlyss Repress, MD  ?traZODone (DESYREL) 50 MG tablet Take 1 tablet (50 mg total) by mouth at bedtime. 12/28/16   Rolland Porter, MD  ?   ? ?Allergies    ?Lamotrigine, Valproate sodium, Cefdinir, Doxycycline, Reglan [metoclopramide], and Nitrofurantoin   ? ?Review of Systems   ?Review of Systems  ?Gastrointestinal:  Positive for abdominal pain.  ? ?Physical Exam ?Updated Vital Signs ?BP 124/69 (BP Location: Right Arm)   Pulse (!) 51   Temp 98 ?F (36.7 ?C) (Oral)   Resp 18   Ht 5\' 1"  (1.549 m)   Wt 96.6 kg   LMP 06/03/2021   SpO2 100%   BMI 40.25 kg/m?  ?Physical Exam ?Vitals and nursing note reviewed.  ?Constitutional:   ?   General: She is not in acute  distress. ?   Appearance: She is not ill-appearing.  ?HENT:  ?   Head: Atraumatic.  ?Eyes:  ?   Conjunctiva/sclera: Conjunctivae normal.  ?Cardiovascular:  ?   Rate and Rhythm: Normal rate and regular rhythm.  ?   Pulses: Normal pulses.  ?   Heart sounds: No murmur heard. ?Pulmonary:  ?   Effort: Pulmonary effort is normal. No respiratory distress.  ?   Breath sounds: Normal breath sounds.  ?Abdominal:  ?   General: There is no distension.  ?   Palpations: Abdomen is soft.  ?   Tenderness: There is abdominal tenderness.  ?   Comments: Abdomen is soft, rounded, diffusely tender to palpation, hyperactive bowel sounds  ?Musculoskeletal:     ?   General: Normal range of motion.  ?   Cervical back: Normal range of motion.  ?Skin: ?    General: Skin is warm and dry.  ?   Capillary Refill: Capillary refill takes less than 2 seconds.  ?Neurological:  ?   General: No focal deficit present.  ?   Mental Status: She is alert.  ?Psychiatric:     ?   Mood and Affect: Mood normal.  ? ? ?ED Results / Procedures / Treatments   ?Labs ?(all labs ordered are listed, but only abnormal results are displayed) ?Labs Reviewed  ?C DIFFICILE QUICK SCREEN W PCR REFLEX    ?LIPASE, BLOOD  ?COMPREHENSIVE METABOLIC PANEL  ?CBC  ?URINALYSIS, ROUTINE W REFLEX MICROSCOPIC  ?PREGNANCY, URINE  ? ? ?EKG ?None ? ?Radiology ?CT ABDOMEN PELVIS W CONTRAST ? ?Result Date: 06/18/2021 ?CLINICAL DATA:  Abdominal pain, acute nonlocalized. Nausea, vomiting and diarrhea for 2 weeks. Bloody stool today. EXAM: CT ABDOMEN AND PELVIS WITH CONTRAST TECHNIQUE: Multidetector CT imaging of the abdomen and pelvis was performed using the standard protocol following bolus administration of intravenous contrast. RADIATION DOSE REDUCTION: This exam was performed according to the departmental dose-optimization program which includes automated exposure control, adjustment of the mA and/or kV according to patient size and/or use of iterative reconstruction technique. CONTRAST:  OMNIPAQUE IOHEXOL 300 MG/ML  SOLN COMPARISON:  None. FINDINGS: Lower chest: No acute abnormality. Hepatobiliary: No focal liver abnormality is seen. Status post cholecystectomy. No biliary dilatation. Pancreas: Unremarkable. No pancreatic ductal dilatation or surrounding inflammatory changes. Spleen: Normal in size without focal abnormality. Adrenals/Urinary Tract: Adrenal glands are unremarkable. Kidneys are normal, without renal calculi, focal lesion, or hydronephrosis. Bladder is unremarkable. Stomach/Bowel: Stomach is within normal limits. Appendix not visualized, however no inflammatory changes in the pericecal region. There is circumferential wall thickening of the sigmoid colon concerning for mild colitis. No  pneumatosis or pericolonic fat stranding. Vascular/Lymphatic: No significant vascular findings are present. No enlarged abdominal or pelvic lymph nodes. Reproductive: Retroverted uterus is present. Bilateral Essure device. No adnexal mass. Other: Fat containing umbilical hernia.  No abdominopelvic ascites. Musculoskeletal: No acute or significant osseous findings. IMPRESSION: 1.  Mild sigmoid colonic wall thickening concerning for colitis. 2. Bowel loops are normal in caliber. No evidence of pneumatosis or extraluminal free air. No pericolonic fat stranding. 3. Appendix not visualized, however no inflammatory changes in the pericecal region. 4.  No evidence of nephrolithiasis or hydronephrosis. 5.  Status post cholecystectomy. Electronically Signed   By: Larose Hires D.O.   On: 06/18/2021 20:58   ? ?Procedures ?Procedures  ? ? ?Medications Ordered in ED ?Medications  ?alum & mag hydroxide-simeth (MAALOX/MYLANTA) 200-200-20 MG/5ML suspension 30 mL (30 mLs Oral Given 06/18/21 2009)  ?dicyclomine (BENTYL)  capsule 10 mg (10 mg Oral Given 06/18/21 2010)  ?iohexol (OMNIPAQUE) 300 MG/ML solution 100 mL (100 mLs Intravenous Contrast Given 06/18/21 2024)  ? ? ?ED Course/ Medical Decision Making/ A&P ?  ?                        ?Medical Decision Making ?Amount and/or Complexity of Data Reviewed ?Labs: ordered. ?Radiology: ordered. ? ?Risk ?OTC drugs. ?Prescription drug management. ? ? ?History:  ?Per HPI ?Social determinants of health: None ? ?Initial impression: ? ?This patient presents to the ED for concern of bloody diarrhea, this involves an extensive number of treatment options, and is a complaint that carries with it a high risk of complications and morbidity.    ? ? ?Lab Tests and EKG: ? ?I Ordered, reviewed, and interpreted labs and EKG.  The pertinent results include:  ?CBC, CMP, pregnancy, UA and lipase all normal ? ? ?Imaging Studies ordered: ? ?I ordered imaging studies including  ?CT scan without acute findings  except for inflammation consistent with colitis ?I independently visualized and interpreted imaging and I agree with the radiologist interpretation.  ? ? ?Medicines ordered and prescription drug management: ? ?I ordered medicati

## 2021-07-20 ENCOUNTER — Encounter: Payer: Self-pay | Admitting: Internal Medicine

## 2021-08-14 ENCOUNTER — Ambulatory Visit: Payer: 59 | Admitting: Internal Medicine

## 2022-07-04 IMAGING — CR DG CHEST 2V
2 series · 2 of 2 positions shown · non-contrast
Comparison: 01/13/2018

CLINICAL DATA: Left-sided chest pain and shortness of breath for 2
hours.

EXAM:
CHEST - 2 VIEW

[w chest pa]
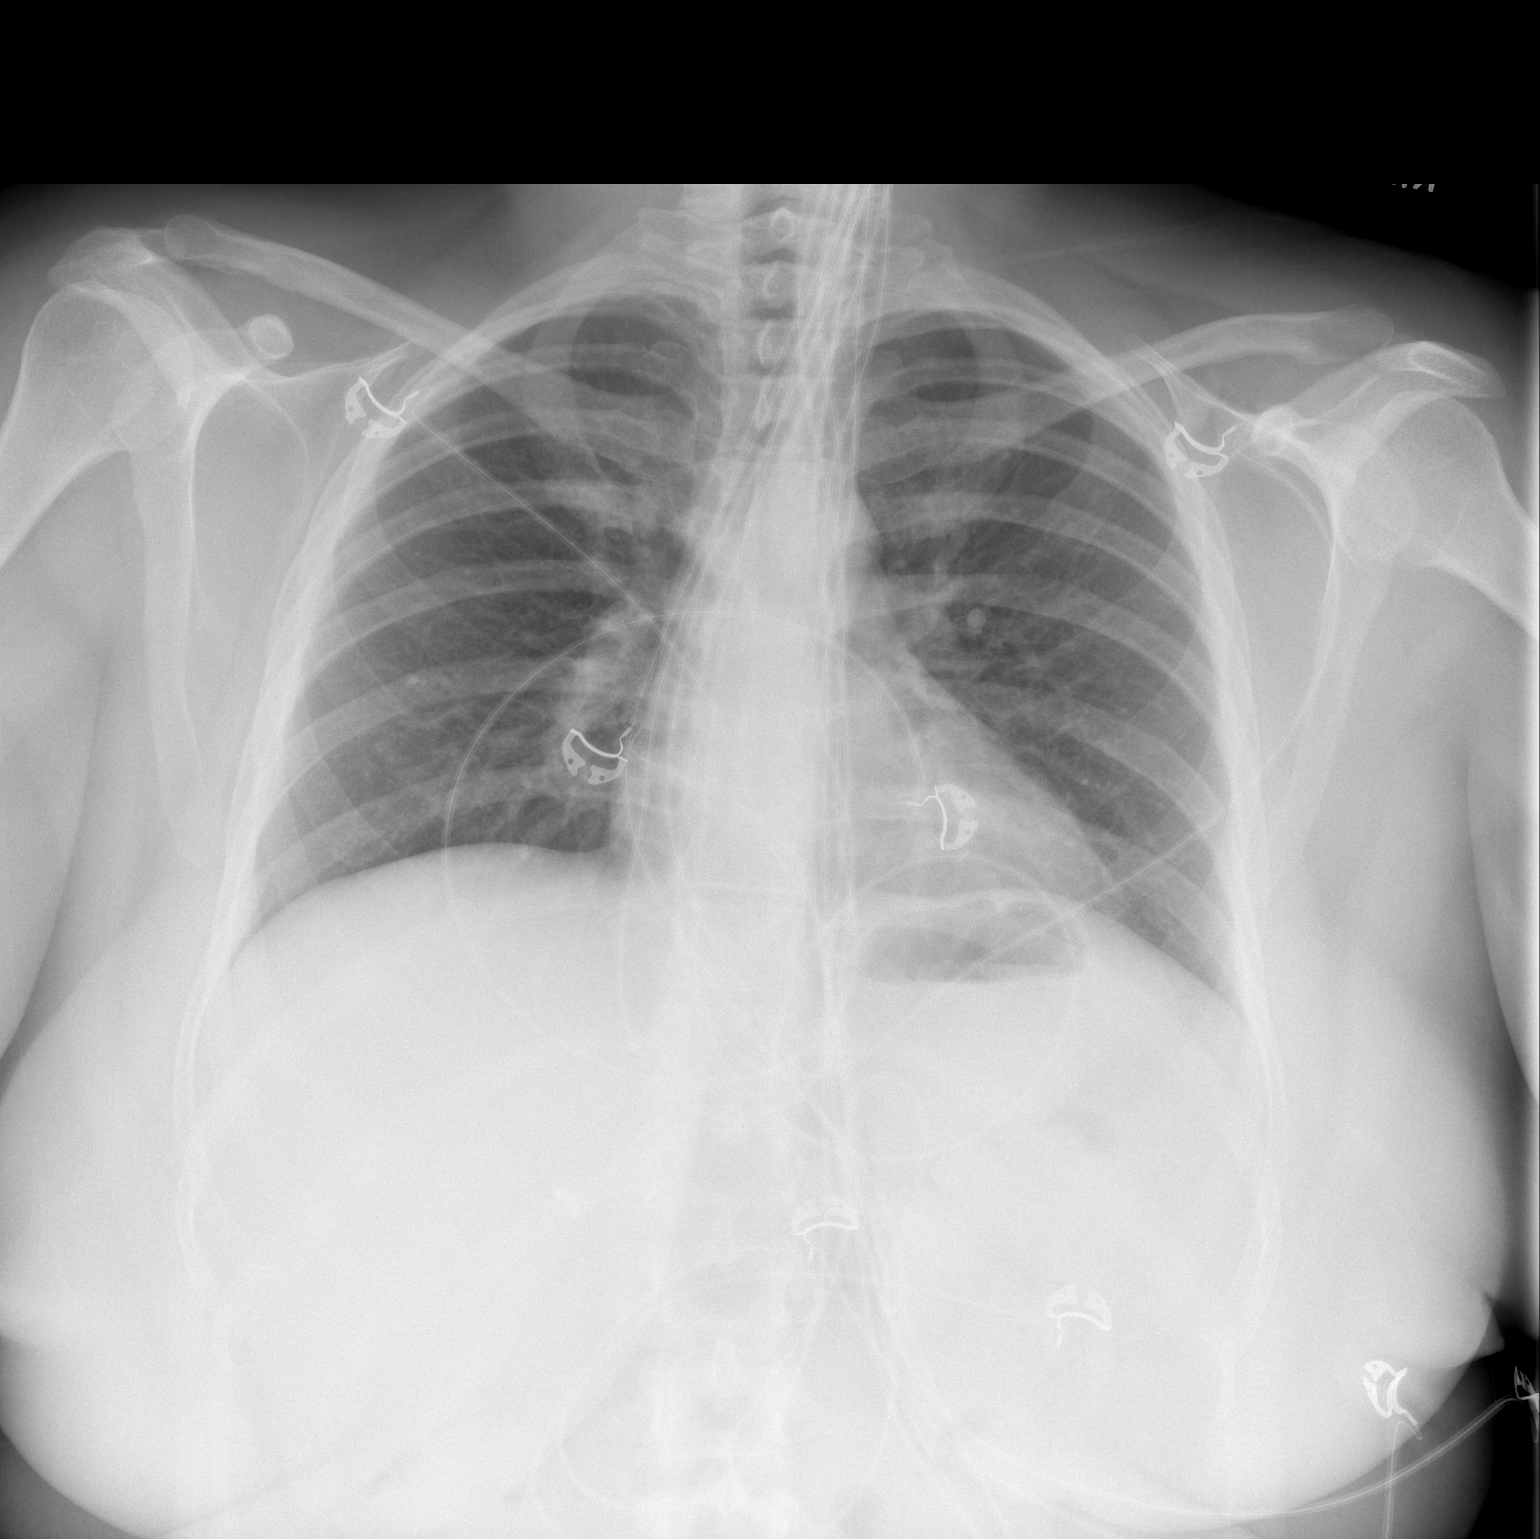

[w chest lat]
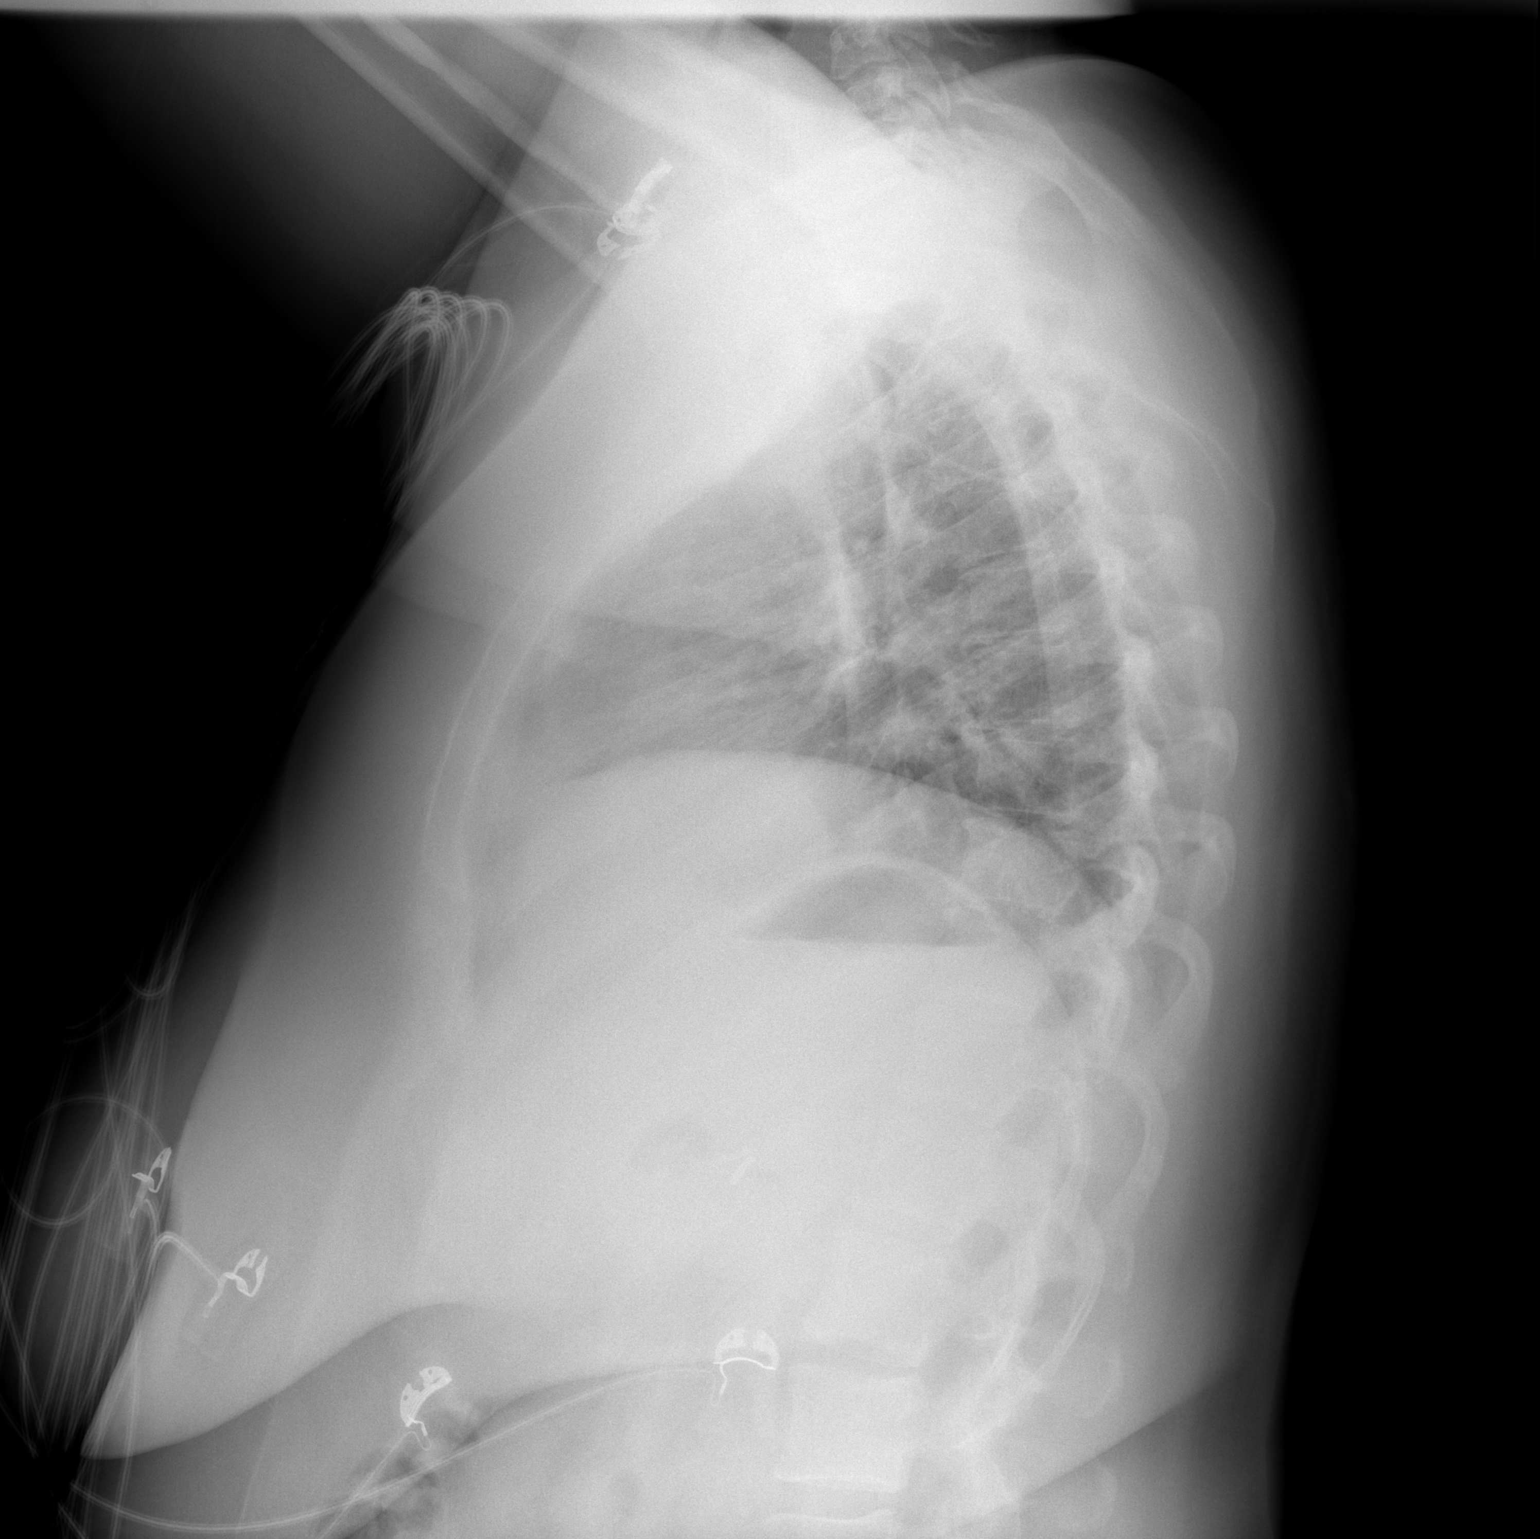

[2 of 2 positions shown; findings below may reference images not displayed]

FINDINGS: The heart size and mediastinal contours are within normal limits.
Both lungs are clear. The visualized skeletal structures are
unremarkable.
IMPRESSION: No active cardiopulmonary disease.

## 2023-04-03 ENCOUNTER — Other Ambulatory Visit: Payer: Self-pay

## 2023-04-03 ENCOUNTER — Emergency Department (HOSPITAL_BASED_OUTPATIENT_CLINIC_OR_DEPARTMENT_OTHER)
Admission: EM | Admit: 2023-04-03 | Discharge: 2023-04-03 | Disposition: A | Discharge status: Home / Self Care | Payer: Self-pay | Attending: Emergency Medicine | Admitting: Emergency Medicine

## 2023-04-03 ENCOUNTER — Encounter (HOSPITAL_BASED_OUTPATIENT_CLINIC_OR_DEPARTMENT_OTHER): Payer: Self-pay | Admitting: *Deleted

## 2023-04-03 ENCOUNTER — Emergency Department (HOSPITAL_BASED_OUTPATIENT_CLINIC_OR_DEPARTMENT_OTHER): Payer: Self-pay

## 2023-04-03 DIAGNOSIS — G43809 Other migraine, not intractable, without status migrainosus: Secondary | ICD-10-CM | POA: Insufficient documentation

## 2023-04-03 DIAGNOSIS — R55 Syncope and collapse: Secondary | ICD-10-CM | POA: Insufficient documentation

## 2023-04-03 LAB — URINALYSIS, ROUTINE W REFLEX MICROSCOPIC
Bilirubin Urine: NEGATIVE
Glucose, UA: NEGATIVE mg/dL
Ketones, ur: NEGATIVE mg/dL
Leukocytes,Ua: NEGATIVE
Nitrite: NEGATIVE
Protein, ur: NEGATIVE mg/dL
Specific Gravity, Urine: 1.02 (ref 1.005–1.030)
pH: 5.5 (ref 5.0–8.0)

## 2023-04-03 LAB — URINALYSIS, MICROSCOPIC (REFLEX): WBC, UA: NONE SEEN WBC/hpf (ref 0–5)

## 2023-04-03 LAB — BASIC METABOLIC PANEL
Anion gap: 7 (ref 5–15)
BUN: 9 mg/dL (ref 6–20)
CO2: 25 mmol/L (ref 22–32)
Calcium: 9.1 mg/dL (ref 8.9–10.3)
Chloride: 104 mmol/L (ref 98–111)
Creatinine, Ser: 0.78 mg/dL (ref 0.44–1.00)
GFR, Estimated: >60 mL/min (ref >60)
Glucose, Bld: 99 mg/dL (ref 70–99)
Potassium: 3.9 mmol/L (ref 3.5–5.1)
Sodium: 136 mmol/L (ref 135–145)

## 2023-04-03 LAB — CBC
HCT: 33.6 % — ABNORMAL LOW (ref 36.0–46.0)
Hemoglobin: 11.2 g/dL — ABNORMAL LOW (ref 12.0–15.0)
MCH: 27.9 pg (ref 26.0–34.0)
MCHC: 33.3 g/dL (ref 30.0–36.0)
MCV: 83.8 fL (ref 80.0–100.0)
Platelets: 303 10*3/uL (ref 150–400)
RBC: 4.01 MIL/uL (ref 3.87–5.11)
RDW: 12.9 % (ref 11.5–15.5)
WBC: 7.3 10*3/uL (ref 4.0–10.5)
nRBC: 0 % (ref 0.0–0.2)

## 2023-04-03 LAB — CBG MONITORING, ED: Glucose-Capillary: 89 mg/dL (ref 70–99)

## 2023-04-03 LAB — PREGNANCY, URINE: Preg Test, Ur: NEGATIVE

## 2023-04-03 MED ORDER — ACETAMINOPHEN 500 MG PO TABS
1000.0000 mg | ORAL_TABLET | Freq: Once | ORAL | Status: AC
  Administered 2023-04-03: 1000 mg via ORAL
  Filled 2023-04-03: qty 2

## 2023-04-03 MED ORDER — PROCHLORPERAZINE EDISYLATE 10 MG/2ML IJ SOLN
10.0000 mg | Freq: Once | INTRAMUSCULAR | Status: AC
  Administered 2023-04-03: 10 mg via INTRAVENOUS
  Filled 2023-04-03: qty 2

## 2023-04-03 MED ORDER — SODIUM CHLORIDE 0.9 % IV BOLUS
1000.0000 mL | Freq: Once | INTRAVENOUS | Status: AC
  Administered 2023-04-03: 1000 mL via INTRAVENOUS

## 2023-04-03 MED ORDER — DIPHENHYDRAMINE HCL 50 MG/ML IJ SOLN
25.0000 mg | Freq: Once | INTRAMUSCULAR | Status: AC
  Administered 2023-04-03: 25 mg via INTRAVENOUS
  Filled 2023-04-03: qty 1

## 2023-04-03 MED ORDER — DEXAMETHASONE SODIUM PHOSPHATE 10 MG/ML IJ SOLN
10.0000 mg | Freq: Once | INTRAMUSCULAR | Status: AC
  Administered 2023-04-03: 10 mg via INTRAVENOUS
  Filled 2023-04-03: qty 1

## 2023-04-03 NOTE — ED Notes (Signed)
Patient was provided a can of ginger ale.

## 2023-04-03 NOTE — ED Triage Notes (Signed)
Pt is here for severe headache which began this am and has been associated with nausea, sensitivity light and one episode of syncope pta.  Pt has hx of migraines but states that she has never had a migraine this severe.  No neuro deficits.

## 2023-04-03 NOTE — ED Provider Notes (Addendum)
[place] [facility] DEPARTMENT AT [facility] Provider Note   CSN: [id number] Arrival date & time: 04/03/23  1820     History  Chief Complaint  Patient presents with   Headache   Loss of Consciousness    Emily Howe is a 38 y.o. female.   Headache Associated symptoms: syncope   Loss of Consciousness Associated symptoms: headaches   38 year old female history of bipolar 1, migraines, MS presenting for headache.  Woke up this morning with headache that is right-sided.  Associate with photophobia.  No vision changes or weakness or numbness.  Headache is been gradually worsening.  Not sudden onset or maximal intensity at onset.  She had some nausea but no vomiting.  She endorses syncope at home and did hit her head.  No seizure.  She used to take medicine for this but it stopped working so she stopped taking it.  No fevers or chills or neck stiffness.     Home Medications Prior to Admission medications   Medication Sig Start Date End Date Taking? Authorizing Provider  acyclovir (ZOVIRAX) 200 MG capsule Take 200 mg by mouth 5 (five) times daily.    [provider]  dicyclomine (BENTYL) 20 MG tablet Take 1 tablet (20 mg total) by mouth 2 (two) times daily. 03/13/21   Renne Crigler, PA-C  dicyclomine (BENTYL) 20 MG tablet Take 1 tablet (20 mg total) by mouth 2 (two) times daily. 06/18/21   Janell Quiet, PA-C  ibuprofen (ADVIL,MOTRIN) 800 MG tablet Take 1 tablet (800 mg total) by mouth every 8 (eight) hours as needed. 05/12/17   Long, Arlyss Repress, MD  lithium 300 MG tablet Take 300 mg by mouth 3 (three) times daily.    [provider]  LORazepam (ATIVAN) 1 MG tablet Take 1 tablet (1 mg total) by mouth 3 (three) times daily as needed for up to 9 doses for anxiety. 12/03/17   Azalia Bilis, MD  omeprazole (PRILOSEC) 20 MG capsule Take 1 capsule (20 mg total) by mouth 2 (two) times daily. 12/28/16   Rolland Porter, MD  ondansetron (ZOFRAN ODT) 4 MG disintegrating  tablet 4mg  ODT q4 hours prn nausea/vomit 01/13/18   Melene Plan, DO  Peginterferon Beta-1a (PLEGRIDY [place]) Inject 160 mg into the skin.    [provider]  potassium chloride SA (K-DUR,KLOR-CON) 20 MEQ tablet Take 2 tablets (40 mEq total) by mouth daily for 3 days. 12/02/17 12/05/17  Aviva Kluver B, PA-C  propranolol (INDERAL) 10 MG tablet Take 10 mg by mouth every evening.     [provider]  traMADol (ULTRAM) 50 MG tablet Take 1 tablet (50 mg total) by mouth every 6 (six) hours as needed. 05/12/17   Long, Arlyss Repress, MD  traZODone (DESYREL) 50 MG tablet Take 1 tablet (50 mg total) by mouth at bedtime. 12/28/16   Rolland Porter, MD      Allergies    Lamotrigine, Valproate sodium, Cefdinir, Doxycycline, Reglan [metoclopramide], and Nitrofurantoin    Review of Systems   Review of Systems  Cardiovascular:  Positive for syncope.  Neurological:  Positive for headaches.  Review of systems completed and notable as per HPI.  ROS otherwise negative.   Physical Exam Updated Vital Signs BP 126/64   Pulse 70   Temp 98 F (36.7 C) (Oral)   Resp 18   Wt 99.8 kg   LMP 04/02/2023 (Exact Date)   SpO2 100%   BMI 41.57 kg/m  Physical Exam Vitals and nursing note reviewed.  Constitutional:      General: She is not in acute distress.    Appearance: She is well-developed.  HENT:     Head: Normocephalic and atraumatic.     Nose: Nose normal.     Mouth/Throat:     Mouth: Mucous membranes are moist.     Pharynx: Oropharynx is clear.  Eyes:     Extraocular Movements: Extraocular movements intact.     Conjunctiva/sclera: Conjunctivae normal.     Pupils: Pupils are equal, round, and reactive to light.  Cardiovascular:     Rate and Rhythm: Normal rate and regular rhythm.     Pulses: Normal pulses.     Heart sounds: Normal heart sounds. No murmur heard. Pulmonary:     Effort: Pulmonary effort is normal. No respiratory distress.     Breath sounds: Normal breath sounds.  Abdominal:      Palpations: Abdomen is soft.     Tenderness: There is no abdominal tenderness.  Musculoskeletal:        General: No swelling.     Cervical back: Normal range of motion and neck supple. No rigidity or tenderness.     Right lower leg: No edema.     Left lower leg: No edema.  Skin:    General: Skin is warm and dry.     Capillary Refill: Capillary refill takes less than 2 seconds.  Neurological:     General: No focal deficit present.     Mental Status: She is alert and oriented to person, place, and time. Mental status is at baseline.     Cranial Nerves: No cranial nerve deficit.     Sensory: No sensory deficit.     Motor: No weakness.     Coordination: Coordination normal.  Psychiatric:        Mood and Affect: Mood normal.     ED Results / Procedures / Treatments   Labs (all labs ordered are listed, but only abnormal results are displayed) Labs Reviewed  CBC - Abnormal; Notable for the following components:      Result Value   Hemoglobin 11.2 (*)    HCT 33.6 (*)    All other components within normal limits  URINALYSIS, ROUTINE W REFLEX MICROSCOPIC - Abnormal; Notable for the following components:   Hgb urine dipstick MODERATE (*)    All other components within normal limits  URINALYSIS, MICROSCOPIC (REFLEX) - Abnormal; Notable for the following components:   Bacteria, UA RARE (*)    All other components within normal limits  BASIC METABOLIC PANEL  PREGNANCY, URINE  CBG MONITORING, ED    EKG EKG Interpretation Date/Time:  Thursday April 03 2023 19:10:54 EST Ventricular Rate:  69 PR Interval:  174 QRS Duration:  109 QT Interval:  407 QTC Calculation: 436 R Axis:   -20  Text Interpretation: Sinus rhythm Borderline left axis deviation Low voltage, precordial leads Confirmed by Fulton Reek [phone]) on 04/03/2023 8:37:56 PM  Radiology CT Head Wo Contrast Result Date: 04/03/2023 CLINICAL DATA:  Headache, increasing frequency or severity. EXAM: CT HEAD WITHOUT  CONTRAST TECHNIQUE: Contiguous axial images were obtained from the base of the skull through the vertex without intravenous contrast. RADIATION DOSE REDUCTION: This exam was performed according to the departmental dose-optimization program which includes automated exposure control, adjustment of the mA and/or kV according to patient size and/or use of iterative reconstruction technique. COMPARISON:  CT head 12/28/2016 FINDINGS: Brain: No intracranial hemorrhage, mass effect, or evidence of acute infarct. No hydrocephalus. No extra-axial fluid  collection. Vascular: No hyperdense vessel or unexpected calcification. Skull: No fracture or focal lesion. Sinuses/Orbits: No acute finding. Other: None. IMPRESSION: No acute intracranial abnormality. Electronically Signed   By: Minerva Fester M.D.   On: 04/03/2023 21:37    Procedures Procedures    Medications Ordered in ED Medications  sodium chloride 0.9 % bolus 1,000 mL (0 mLs Intravenous Stopped 04/03/23 2208)  dexamethasone (DECADRON) injection 10 mg (10 mg Intravenous Given 04/03/23 2106)  prochlorperazine (COMPAZINE) injection 10 mg (10 mg Intravenous Given 04/03/23 2106)  diphenhydrAMINE (BENADRYL) injection 25 mg (25 mg Intravenous Given 04/03/23 2107)  acetaminophen (TYLENOL) tablet 1,000 mg (1,000 mg Oral Given 04/03/23 2106)  diphenhydrAMINE (BENADRYL) injection 25 mg (25 mg Intravenous Given 04/03/23 2136)    ED Course/ Medical Decision Making/ A&P                                 Medical Decision Making Amount and/or Complexity of Data Reviewed Labs: ordered. Radiology: ordered.  Risk OTC drugs. Prescription drug management.   Medical Decision Making:   Shauntrice Grabe is a 38 y.o. female who presented to the ED today with headache.  Vital signs reviewed.  On exam patient is well-appearing.  She reports headache worse than prior and different than prior.  Is not sudden onset of low suspicion for subarachnoid hemorrhage.  No fever or signs of  meningismus.  I will obtain CT head given change from prior to evaluate for intracranial mass, bleeding, hydrocephalus.  Will treat symptomatically as well.  She does report episode of syncope earlier.  No seizure-like activity.  No murmur, EKG nonischemic, no signs of arrhythmia.  No signs or symptoms of PE.  Will monitor on telemetry here.   Patient placed on continuous vitals and telemetry monitoring while in ED which was reviewed periodically.  Reviewed and confirmed nursing documentation for past medical history, family history, social history.   Reassessment and Plan:   Labwork reviewed, notable for mild anemia.  CT head reviewed, no signs acute intracranial abnormality.  On reassessment she is completely asymptomatic.  Headache is completely resolved.  Still has normal neurologic exam.  No recurrent dizziness or syncope.  No arrhythmia on monitor.  I suspect this was a severe migraine.  Her work appears very reassuring.  I think she can safely follow-up with her neurologist and PCP.  I gave her strict return precautions.  She was comfortable this plan.   Patient's presentation is most consistent with acute complicated illness / injury requiring diagnostic workup.    Final Clinical Impression(s) / ED Diagnoses Final diagnoses:  Other migraine without status migrainosus, not intractable    Rx / DC Orders ED Discharge Orders     None         Laurence Spates, MD 04/03/23 2247

## 2023-04-03 NOTE — Discharge Instructions (Addendum)
Your CT scan and lab work today were reassuring.  I recommend you follow-up with your primary care doctor and neurologist.  If you develop severe headache, weakness, numbness, vision changes or any other new concerning symptoms you should return to the ED.

## 2023-04-03 NOTE — ED Notes (Signed)
Pt called out with c/o feeling like she's having a reaction to compazine. Pt states she has a hx of anxiety but feels like the compazine is making her more anxious. Provider made aware. See new order.

## 2023-04-03 NOTE — ED Notes (Signed)
Patient transported to CT 

## 2023-04-16 ENCOUNTER — Other Ambulatory Visit: Payer: Self-pay | Admitting: Medical Genetics

## 2023-08-16 IMAGING — CT CT ABD-PELV W/ CM
2 of 4 series · 15 of 46 positions shown, 17 images · IV contrast (agent unspecified)
Comparison: None.

CLINICAL DATA: Abdominal pain, acute nonlocalized. Nausea, vomiting
and diarrhea for 2 weeks. Bloody stool today.

EXAM:
CT ABDOMEN AND PELVIS WITH CONTRAST
TECHNIQUE: Multidetector CT imaging of the abdomen and pelvis was performed
using the standard protocol following bolus administration of
intravenous contrast.

[Series 2: axial st · axial · 0.98mm/px · z∈[-263,+182]mm · 12 of 103 slices shown, 14 images]
[im 9/103  soft-tissue]
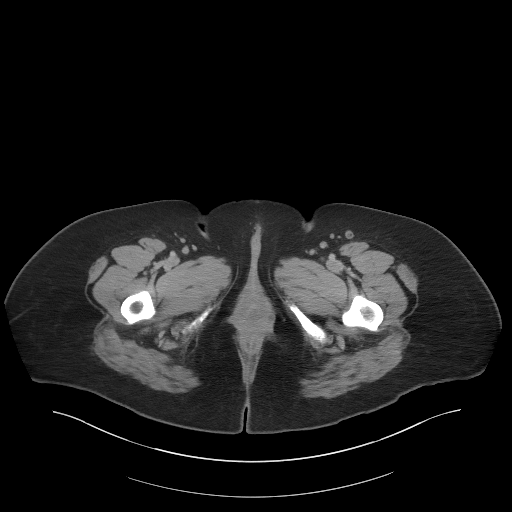
[im 9/103  bone]
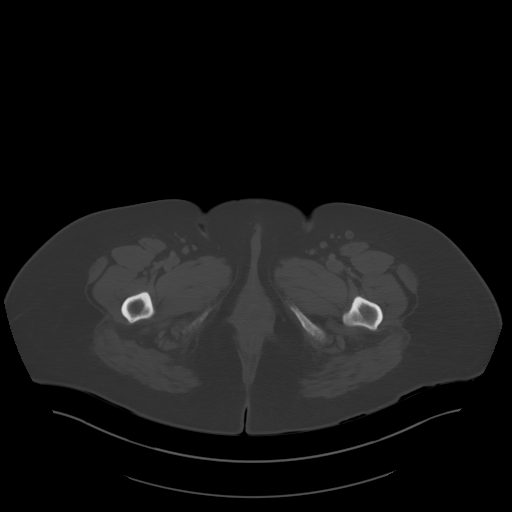
[im 17/103  soft-tissue]
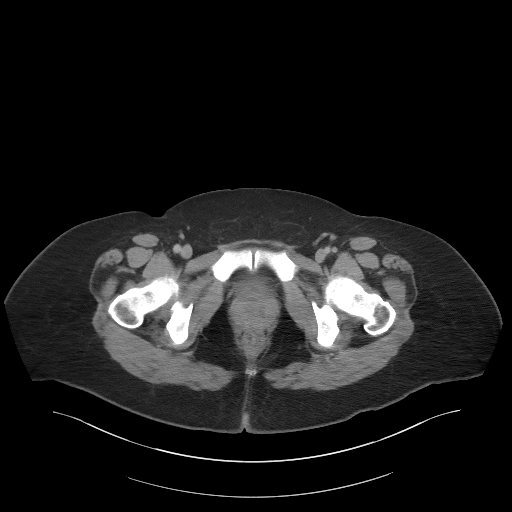
[im 25/103  soft-tissue]
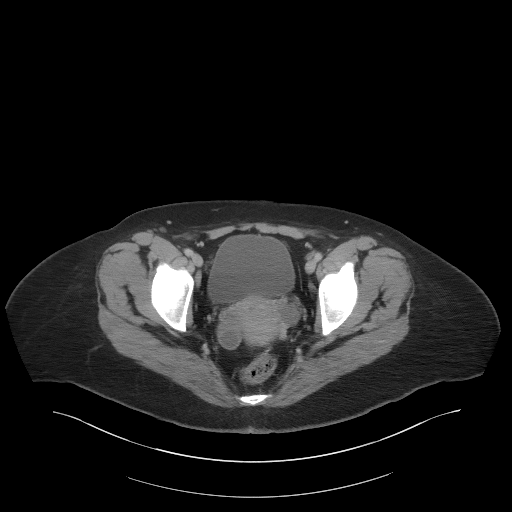
[im 33/103  soft-tissue]
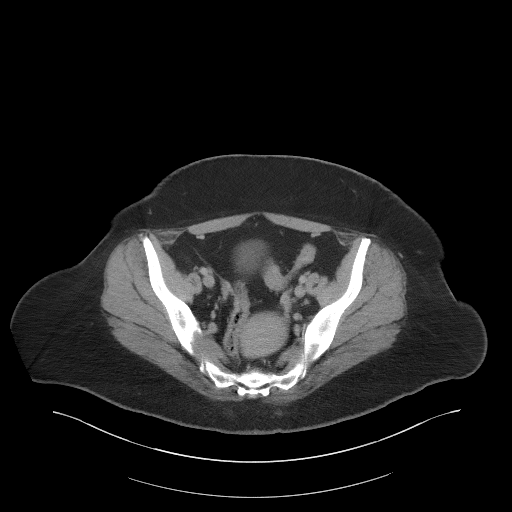
[im 41/103  soft-tissue]
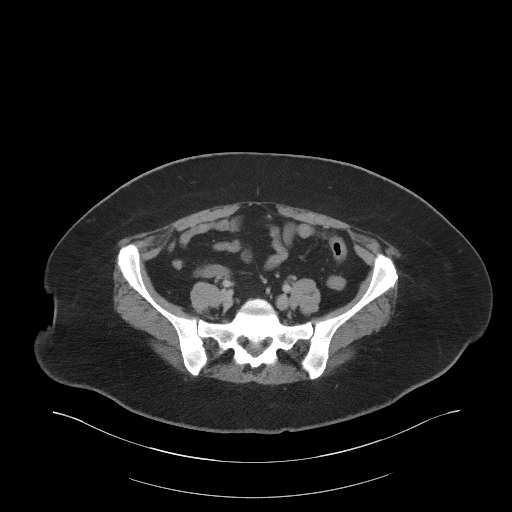
[im 49/103  soft-tissue]
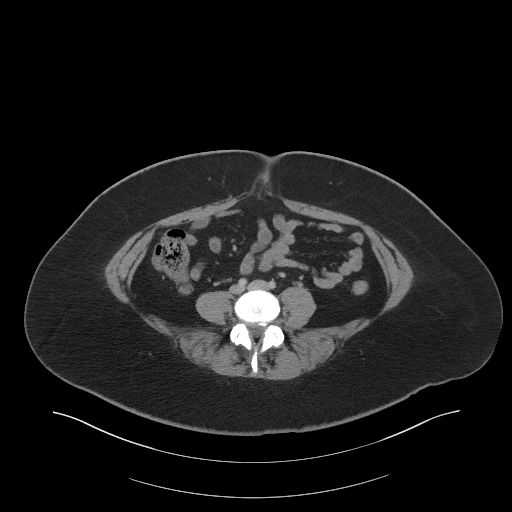
[im 58/103  soft-tissue]
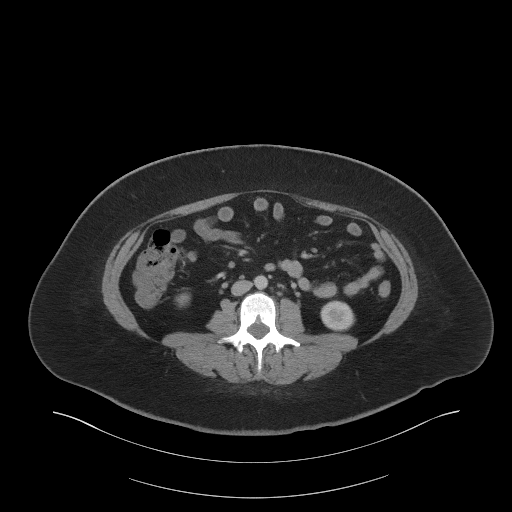
[im 66/103  soft-tissue]
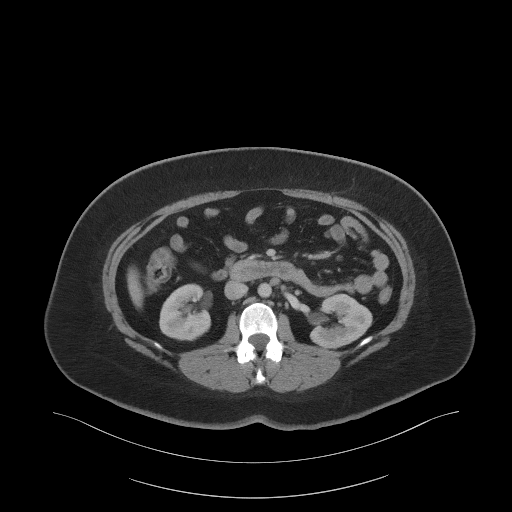
[im 74/103  soft-tissue]
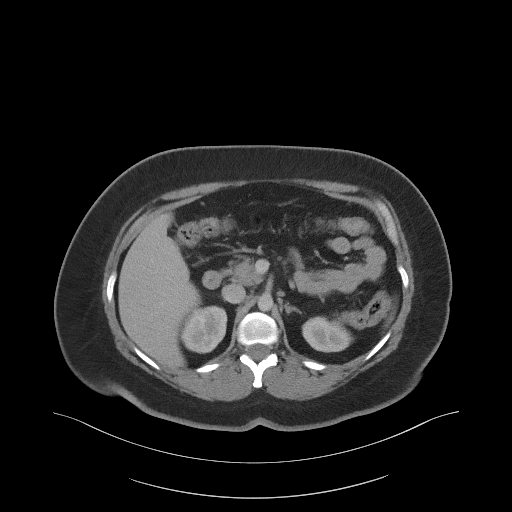
[im 74/103  bone]
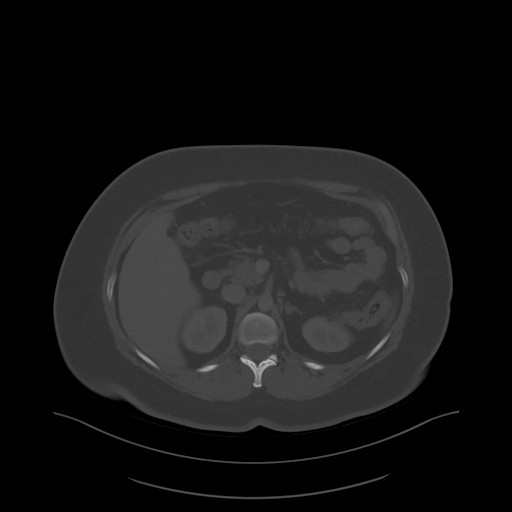
[im 82/103  soft-tissue]
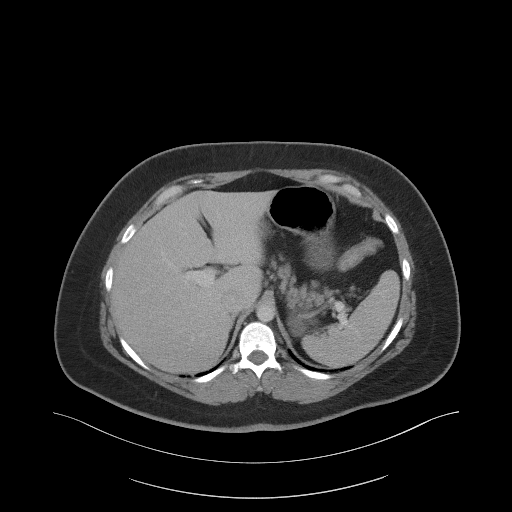
[im 90/103  soft-tissue]
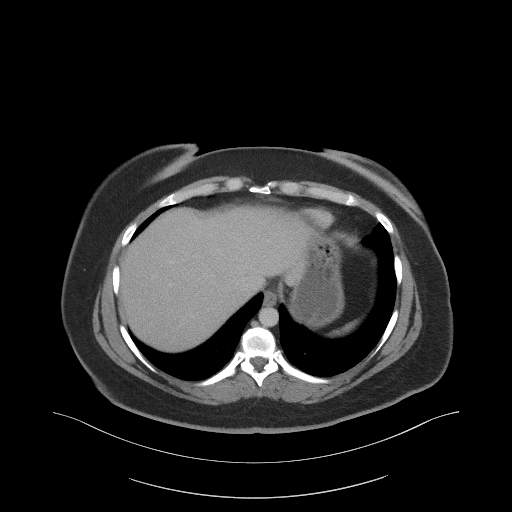
[im 98/103  soft-tissue]
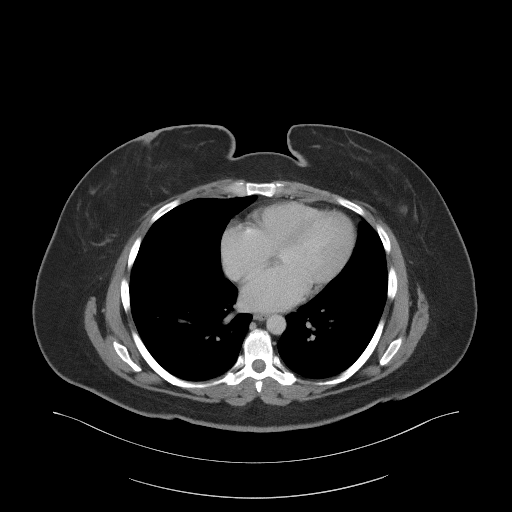

[Series 5: coronal st · coronal · 0.72mm/px · 3 of 101 slices shown]
[im 34/101  soft-tissue]
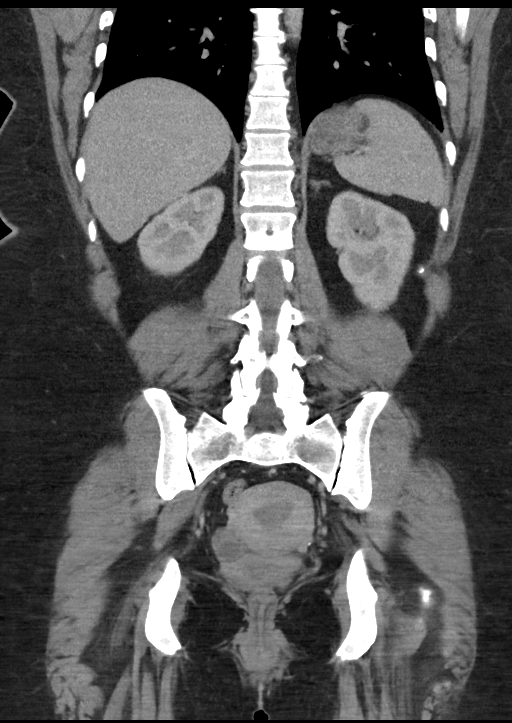
[im 45/101  soft-tissue]
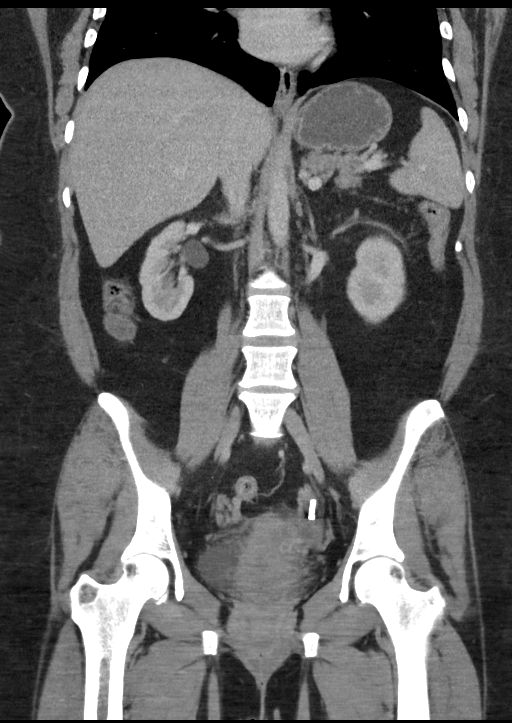
[im 56/101  soft-tissue]
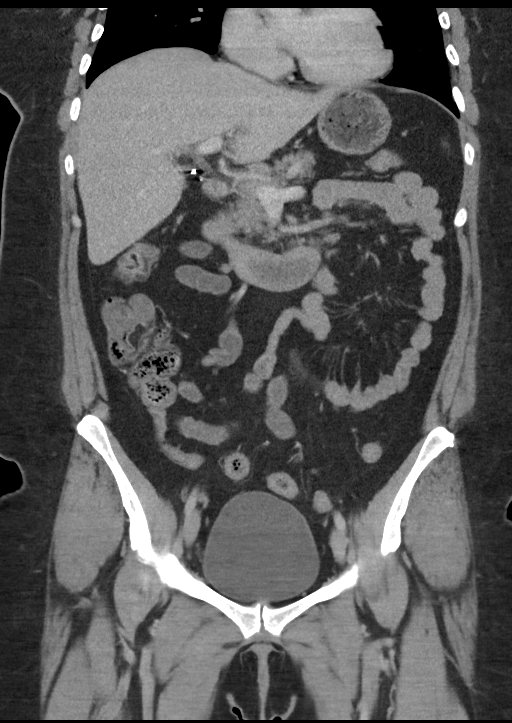

[15 of 46 positions shown; findings below may reference images not displayed]

RADIATION DOSE REDUCTION: This exam was performed according to the
departmental dose-optimization program which includes automated
exposure control, adjustment of the mA and/or kV according to
patient size and/or use of iterative reconstruction technique.

CONTRAST:  100mL OMNIPAQUE IOHEXOL 300 MG/ML  SOLN
FINDINGS: Lower chest: No acute abnormality.

Hepatobiliary: No focal liver abnormality is seen. Status post
cholecystectomy. No biliary dilatation.

Pancreas: Unremarkable. No pancreatic ductal dilatation or
surrounding inflammatory changes.

Spleen: Normal in size without focal abnormality.

Adrenals/Urinary Tract: Adrenal glands are unremarkable. Kidneys are
normal, without renal calculi, focal lesion, or hydronephrosis.
Bladder is unremarkable.

Stomach/Bowel: Stomach is within normal limits. Appendix not
visualized, however no inflammatory changes in the pericecal region.
There is circumferential wall thickening of the sigmoid colon
concerning for mild colitis. No pneumatosis or pericolonic fat
stranding.

Vascular/Lymphatic: No significant vascular findings are present. No
enlarged abdominal or pelvic lymph nodes.

Reproductive: Retroverted uterus is present. Bilateral Essure
device. No adnexal mass.

Other: Fat containing umbilical hernia.  No abdominopelvic ascites.

Musculoskeletal: No acute or significant osseous findings.
IMPRESSION: 1.  Mild sigmoid colonic wall thickening concerning for colitis.

2. Bowel loops are normal in caliber. No evidence of pneumatosis or
extraluminal free air. No pericolonic fat stranding.

3. Appendix not visualized, however no inflammatory changes in the
pericecal region.

4.  No evidence of nephrolithiasis or hydronephrosis.

5.  Status post cholecystectomy.

## 2023-10-16 ENCOUNTER — Emergency Department (HOSPITAL_BASED_OUTPATIENT_CLINIC_OR_DEPARTMENT_OTHER): Admission: EM | Admit: 2023-10-16 | Discharge: 2023-10-16 | Disposition: A | Discharge status: Home / Self Care

## 2023-10-16 ENCOUNTER — Encounter (HOSPITAL_BASED_OUTPATIENT_CLINIC_OR_DEPARTMENT_OTHER): Payer: Self-pay

## 2023-10-16 ENCOUNTER — Other Ambulatory Visit: Payer: Self-pay

## 2023-10-16 DIAGNOSIS — W57XXXA Bitten or stung by nonvenomous insect and other nonvenomous arthropods, initial encounter: Secondary | ICD-10-CM | POA: Insufficient documentation

## 2023-10-16 DIAGNOSIS — S40861A Insect bite (nonvenomous) of right upper arm, initial encounter: Secondary | ICD-10-CM | POA: Insufficient documentation

## 2023-10-16 MED ORDER — METHYLPREDNISOLONE SODIUM SUCC 125 MG IJ SOLR
125.0000 mg | Freq: Once | INTRAMUSCULAR | Status: AC
  Administered 2023-10-16: 125 mg via INTRAVENOUS
  Filled 2023-10-16: qty 2

## 2023-10-16 MED ORDER — FAMOTIDINE IN NACL 20-0.9 MG/50ML-% IV SOLN
20.0000 mg | Freq: Once | INTRAVENOUS | Status: AC
  Administered 2023-10-16: 20 mg via INTRAVENOUS
  Filled 2023-10-16: qty 50

## 2023-10-16 MED ORDER — HYDROXYZINE HCL 25 MG PO TABS
25.0000 mg | ORAL_TABLET | Freq: Once | ORAL | Status: AC
  Administered 2023-10-16: 25 mg via ORAL
  Filled 2023-10-16: qty 1

## 2023-10-16 NOTE — ED Provider Notes (Signed)
 [place] [facility] Provider Note   CSN: [id number] Arrival date & time: 10/16/23  2046     Patient presents with: Arm Pain   Emily Howe is a 38 y.o. female who presents with concern for possible bug bite to her right forearm that occurred about an hour prior to arrival.  States she felt a sharp pain by her antecubital fossa felt like a sting.  Since then, she has felt a burning sensation in her arm by that site.  She feels like there is a mild rash at the area as well.  Denies any diffuse body rash, nausea or vomiting, dizziness, difficulty breathing or swallowing.  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    Arm Pain       Prior to Admission medications   Medication Sig Start Date End Date Taking? Authorizing Provider  acyclovir (ZOVIRAX) 200 MG capsule Take 200 mg by mouth 5 (five) times daily.    [provider]  dicyclomine  (BENTYL ) 20 MG tablet Take 1 tablet (20 mg total) by mouth 2 (two) times daily. 03/13/21   Geiple, Joshua, PA-C  dicyclomine  (BENTYL ) 20 MG tablet Take 1 tablet (20 mg total) by mouth 2 (two) times daily. 06/18/21   Conklin, Erica R, PA-C  ibuprofen  (ADVIL ,MOTRIN ) 800 MG tablet Take 1 tablet (800 mg total) by mouth every 8 (eight) hours as needed. 05/12/17   Long, Joshua G, MD  lithium  300 MG tablet Take 300 mg by mouth 3 (three) times daily.    [provider]  LORazepam  (ATIVAN ) 1 MG tablet Take 1 tablet (1 mg total) by mouth 3 (three) times daily as needed for up to 9 doses for anxiety. 12/03/17   Baxter Drivers, MD  omeprazole  (PRILOSEC) 20 MG capsule Take 1 capsule (20 mg total) by mouth 2 (two) times daily. 12/28/16   Lynwood Anes, MD  ondansetron  (ZOFRAN  ODT) 4 MG disintegrating tablet 4mg  ODT q4 hours prn nausea/vomit 01/13/18   Floyd, Dan, DO  Peginterferon Beta-1a (PLEGRIDY [place]) Inject 160 mg into the skin.    [provider]  potassium chloride  SA (K-DUR,KLOR-CON ) 20 MEQ tablet Take 2 tablets (40 mEq total) by mouth daily for 3 days. 12/02/17 12/05/17  Jason Fish B, PA-C  propranolol (INDERAL) 10 MG tablet Take 10 mg by mouth every evening.     [provider]  traMADol  (ULTRAM ) 50 MG tablet Take 1 tablet (50 mg total) by mouth every 6 (six) hours as needed. 05/12/17   Long, Joshua G, MD  traZODone  (DESYREL ) 50 MG tablet Take 1 tablet (50 mg total) by mouth at bedtime. 12/28/16   Lynwood Anes, MD    Allergies: Lamotrigine, Valproate sodium, Cefdinir, Doxycycline, Reglan  [metoclopramide ], Sulfa  antibiotics, Nitrofurantoin, and Prochlorperazine     Review of Systems  Musculoskeletal:        Right arm pain    Updated Vital Signs BP (!) 182/97   Pulse 71   Temp 97.9 F (36.6 C) (Oral)   Resp 20   LMP 10/13/2023 (Exact Date)   SpO2 100%   Physical Exam Vitals and nursing note reviewed.  Constitutional:      General: She is not in acute distress.    Appearance: She is well-developed.  HENT:     Head: Normocephalic and atraumatic.  Eyes:     Conjunctiva/sclera: Conjunctivae normal.  Cardiovascular:     Rate and Rhythm:  Normal rate and regular rhythm.     Heart sounds: No murmur heard.    Comments: 2+ radial pulses bilaterally Pulmonary:     Effort: Pulmonary effort is normal. No respiratory distress.     Breath sounds: Normal breath sounds.     Comments: Talking in full sentences without difficulty No stridor.  Lungs clear to auscultation bilaterally Abdominal:     Palpations: Abdomen is soft.     Tenderness: There is no abdominal tenderness.  Musculoskeletal:        General: No swelling.     Cervical back: Neck supple.     Comments: Right arm without any obvious erythema, rashes, or bug bite/sting site.  No edema or erythema. Full range of motion of the right shoulder, right elbow, and right wrist.  Skin:    General: Skin is warm and dry.     Capillary Refill: Capillary  refill takes less than 2 seconds.  Neurological:     Mental Status: She is alert.  Psychiatric:        Mood and Affect: Mood normal.     (all labs ordered are listed, but only abnormal results are displayed) Labs Reviewed - No data to display  EKG: None  Radiology: No results found.   Procedures   Medications Ordered in the ED  methylPREDNISolone  sodium succinate (SOLU-MEDROL ) 125 mg/2 mL injection 125 mg (125 mg Intravenous Given 10/16/23 2115)  famotidine  (PEPCID ) IVPB 20 mg premix (0 mg Intravenous Stopped 10/16/23 2150)  hydrOXYzine  (ATARAX ) tablet 25 mg (25 mg Oral Given 10/16/23 2110)                                    Medical Decision Making Risk Prescription drug management.     Differential diagnosis includes but is not limited to insect bite, bee sting, allergic reaction, DVT, cellulitis  ED Course:  Upon initial evaluation, patient is well-appearing, no acute distress.  Stable vitals aside from her elevated blood pressure upon arrival 182/97.  She is reporting a possible insect bite or sting to the right antecubital fossa area about 1 hour prior to arrival.  I do not see any obvious bite or sting site.  No rashes noted.  No erythema.  Is reporting a pain and burning sensation to the area where she was reportedly stung.  No signs of anaphylaxis that she does not have any rashes, no difficulty breathing, no hypotension, no nausea or vomiting.  Given sudden onset with what felt like a sting, no edema, good radial pulses, lower concern for DVT. No wound or erythema, no concern for infection. Will treat for possible reaction to insect bite or sting with Solu-Medrol , famotidine , hydroxyzine .  Medications Given: Solumedrol Famotidine  Hydroxyzine   Upon re-evaluation, patient reports the stinging sensation to her right arm has improved.  I do not appreciate any redness or rash around this area.  No further signs of allergic reaction such as hypotension, nausea or vomiting,  any difficulties breathing.  Stable and appropriate for discharge home at this time    Impression: Right arm insect bite  Disposition:  The patient was discharged home with instructions to take either Allegra or Zyrtec according to package directions if needed for the stinging sensation.  Follow-up with PCP if symptoms not improving within the next 2 days. Return precautions given.   This chart was dictated using voice recognition software, Dragon. Despite the best efforts of this provider  to proofread and correct errors, errors may still occur which can change documentation meaning.       Final diagnoses:  Insect bite of right arm, initial encounter    ED Discharge Orders     None          Veta Palma, PA-C 10/16/23 2211    Neysa Caron PARAS, DO 10/16/23 2226

## 2023-10-16 NOTE — ED Notes (Signed)
 ED Provider at bedside.

## 2023-10-16 NOTE — ED Triage Notes (Signed)
 Pt reports feeling like she got stung on right arm about an hour ago ans has pain and burning to arm now. No obvious mark noted.

## 2023-10-16 NOTE — Discharge Instructions (Signed)
 You were treated here today for a possible insect bite.  You may continue taking either Zyrtec or Allegra (one or the other, not both) according to package directions to help with itching and pain.   Please follow-up with your PCP if symptoms are not starting to improve within the next 2 days.  Your blood pressure was elevated here today at 182/97. Aim to get 30 minutes of exercise daily and limit intake of processed/fast foods. Please take and record your blood pressures at home. Take them at the same time every day. Follow up with your PCP within the next month to discuss if you may need treatment for your blood pressure.   Please return to the ER for redness around the bite site, new rash, difficulty breathing or swallowing, persistent vomiting, any other new or concerning symptoms

## 2023-12-13 ENCOUNTER — Emergency Department (HOSPITAL_BASED_OUTPATIENT_CLINIC_OR_DEPARTMENT_OTHER)
Admission: EM | Admit: 2023-12-13 | Discharge: 2023-12-13 | Disposition: A | Discharge status: Home / Self Care | Attending: Emergency Medicine | Admitting: Emergency Medicine

## 2023-12-13 ENCOUNTER — Other Ambulatory Visit: Payer: Self-pay

## 2023-12-13 ENCOUNTER — Emergency Department (HOSPITAL_BASED_OUTPATIENT_CLINIC_OR_DEPARTMENT_OTHER)

## 2023-12-13 ENCOUNTER — Encounter (HOSPITAL_BASED_OUTPATIENT_CLINIC_OR_DEPARTMENT_OTHER): Payer: Self-pay

## 2023-12-13 DIAGNOSIS — R202 Paresthesia of skin: Secondary | ICD-10-CM | POA: Diagnosis not present

## 2023-12-13 DIAGNOSIS — R072 Precordial pain: Secondary | ICD-10-CM | POA: Insufficient documentation

## 2023-12-13 DIAGNOSIS — R079 Chest pain, unspecified: Secondary | ICD-10-CM

## 2023-12-13 DIAGNOSIS — R519 Headache, unspecified: Secondary | ICD-10-CM | POA: Insufficient documentation

## 2023-12-13 DIAGNOSIS — H538 Other visual disturbances: Secondary | ICD-10-CM | POA: Insufficient documentation

## 2023-12-13 DIAGNOSIS — Z87891 Personal history of nicotine dependence: Secondary | ICD-10-CM | POA: Insufficient documentation

## 2023-12-13 LAB — CBC
HCT: 34.5 % — ABNORMAL LOW (ref 36.0–46.0)
Hemoglobin: 11.7 g/dL — ABNORMAL LOW (ref 12.0–15.0)
MCH: 29.1 pg (ref 26.0–34.0)
MCHC: 33.9 g/dL (ref 30.0–36.0)
MCV: 85.8 fL (ref 80.0–100.0)
Platelets: 220 K/uL (ref 150–400)
RBC: 4.02 MIL/uL (ref 3.87–5.11)
RDW: 12.4 % (ref 11.5–15.5)
WBC: 8.1 K/uL (ref 4.0–10.5)
nRBC: 0 % (ref 0.0–0.2)

## 2023-12-13 LAB — D-DIMER, QUANTITATIVE: D-Dimer, Quant: <0.27 ug{FEU}/mL (ref 0.00–0.50)

## 2023-12-13 LAB — BASIC METABOLIC PANEL WITH GFR
Anion gap: 13 (ref 5–15)
BUN: 14 mg/dL (ref 6–20)
CO2: 22 mmol/L (ref 22–32)
Calcium: 9.3 mg/dL (ref 8.9–10.3)
Chloride: 104 mmol/L (ref 98–111)
Creatinine, Ser: 0.85 mg/dL (ref 0.44–1.00)
GFR, Estimated: >60 mL/min (ref >60)
Glucose, Bld: 93 mg/dL (ref 70–99)
Potassium: 4 mmol/L (ref 3.5–5.1)
Sodium: 139 mmol/L (ref 135–145)

## 2023-12-13 LAB — TROPONIN T, HIGH SENSITIVITY
Troponin T High Sensitivity: <15 ng/L (ref 0–19)
Troponin T High Sensitivity: <15 ng/L (ref 0–19)

## 2023-12-13 LAB — HCG, SERUM, QUALITATIVE: Preg, Serum: NEGATIVE

## 2023-12-13 MED ORDER — DIPHENHYDRAMINE HCL 50 MG/ML IJ SOLN
25.0000 mg | Freq: Once | INTRAMUSCULAR | Status: AC
  Administered 2023-12-13: 25 mg via INTRAVENOUS
  Filled 2023-12-13: qty 1

## 2023-12-13 MED ORDER — SODIUM CHLORIDE 0.9 % IV BOLUS
1000.0000 mL | Freq: Once | INTRAVENOUS | Status: AC
  Administered 2023-12-13: 1000 mL via INTRAVENOUS

## 2023-12-13 MED ORDER — DEXAMETHASONE 4 MG PO TABS
8.0000 mg | ORAL_TABLET | Freq: Once | ORAL | Status: AC
  Administered 2023-12-13: 8 mg via ORAL
  Filled 2023-12-13: qty 2

## 2023-12-13 MED ORDER — DROPERIDOL 2.5 MG/ML IJ SOLN
2.5000 mg | Freq: Once | INTRAMUSCULAR | Status: AC
  Administered 2023-12-13: 2.5 mg via INTRAVENOUS
  Filled 2023-12-13: qty 2

## 2023-12-13 NOTE — ED Notes (Signed)
 ED Provider at bedside.

## 2023-12-13 NOTE — ED Triage Notes (Signed)
 Patient here POV from Home.  Notes Central discomfort for 2 hours. Worsening and constant. Some SOB and left arm/facial pain (numbing). Some Nausea without Emesis.   NAD noted during triage. A&Ox4. Gcs 15. Ambulatory.

## 2023-12-13 NOTE — Discharge Instructions (Addendum)
 It was a pleasure caring for you today in the emergency department.  Please follow up with your headache specialist   Be sure to get plenty of rest, drink plenty of fluids, take your regularly prescribed medications   Return to the Emergency Department if you have unusual chest pain, pressure, or discomfort, shortness of breath, nausea, vomiting, burping, heartburn, tingling upper body parts, sweating, cold, clammy skin, or racing heartbeat. Call 911 if you think you are having a heart attack. Follow cardiac diet - avoid fatty & fried foods, don't eat too much red meat, eat lots of fruits & vegetables, and dairy products should be low fat. Please lose weight if you are overweight. Become more active with walking, gardening, or any other activity that gets you to moving.  Please return to the emergency department for any worsening or worrisome symptoms.

## 2023-12-13 NOTE — ED Notes (Signed)
 Pt took the decadron  and husband at bedside noted she can't take steroids after he looked up in phone same, due to some sort of stem therapy.  Pt asked to notify EDP, same was made aware.  Reported back to pt.

## 2023-12-13 NOTE — ED Provider Notes (Signed)
 [place] [facility] Provider Note  CSN: [id number] Arrival date & time: 12/13/23 8061  Chief Complaint(s) Chest Pain  HPI Emily Howe is a 38 y.o. female with past medical history as below, significant for bipolar 1 disorder, chronic migraine, blurry vision, vertigo, memory loss who presents to the ED with complaint of chest pain, headache, paresthesias  Patient reports approximately 3 to 4 hours prior to arrival she began having chest pain midsternal.  Started having some paresthesias going down her left arm and began having paresthesias to the left side of her face and a headache, throbbing pulsatile in nature.  Binocular blurry vision.  Feels similar to prior migraines.  No numbness or weakness to her extremities.  No recent falls or head injuries.  No fevers.  No dyspnea.  Does report some nausea but no vomiting.  No change in bowel or bladder function.  No recent travel or sick contacts.  No exogenous hormone use, no recent travel, no history of VTE  Past Medical History Past Medical History:  Diagnosis Date   Bipolar 1 disorder (HCC)    Blurry vision    Chronic migraine    Depression    Memory loss    MS (multiple sclerosis)    Numbness    Vertigo    There are no active problems to display for this patient.  Home Medication(s) Prior to Admission medications   Medication Sig Start Date End Date Taking? Authorizing Provider  acyclovir (ZOVIRAX) 200 MG capsule Take 200 mg by mouth 5 (five) times daily.    [provider]  dicyclomine  (BENTYL ) 20 MG tablet Take 1 tablet (20 mg total) by mouth 2 (two) times daily. 03/13/21   Geiple, Joshua, PA-C  dicyclomine  (BENTYL ) 20 MG tablet Take 1 tablet (20 mg total) by mouth 2 (two) times daily. 06/18/21   Conklin, Erica R, PA-C  ibuprofen  (ADVIL ,MOTRIN ) 800 MG tablet Take 1 tablet (800 mg total) by mouth every 8 (eight) hours as needed. 05/12/17   Long, Fonda MATSU, MD  lithium  300 MG tablet Take 300  mg by mouth 3 (three) times daily.    [provider]  LORazepam  (ATIVAN ) 1 MG tablet Take 1 tablet (1 mg total) by mouth 3 (three) times daily as needed for up to 9 doses for anxiety. 12/03/17   Baxter Drivers, MD  omeprazole  (PRILOSEC) 20 MG capsule Take 1 capsule (20 mg total) by mouth 2 (two) times daily. 12/28/16   Lynwood Anes, MD  ondansetron  (ZOFRAN  ODT) 4 MG disintegrating tablet 4mg  ODT q4 hours prn nausea/vomit 01/13/18   Floyd, Dan, DO  Peginterferon Beta-1a (PLEGRIDY [place]) Inject 160 mg into the skin.    [provider]  potassium chloride  SA (K-DUR,KLOR-CON ) 20 MEQ tablet Take 2 tablets (40 mEq total) by mouth daily for 3 days. 12/02/17 12/05/17  Jason Fish B, PA-C  propranolol (INDERAL) 10 MG tablet Take 10 mg by mouth every evening.     [provider]  traMADol  (ULTRAM ) 50 MG tablet Take 1 tablet (50 mg total) by mouth every 6 (six) hours as needed. 05/12/17   Long, Joshua G, MD  traZODone  (DESYREL ) 50 MG tablet Take 1 tablet (50 mg total) by mouth at bedtime. 12/28/16   Lynwood Anes, MD  Past Surgical History Past Surgical History:  Procedure Laterality Date   APPENDECTOMY     CHOLECYSTECTOMY     KNEE SURGERY     TONSILLECTOMY     TUBAL LIGATION     Family History Family History  Problem Relation Age of Onset   Diabetes Other    Hypertension Other    Cancer Other     Social History Social History   Tobacco Use   Smoking status: Former    Current packs/day: 0.00    Types: Cigarettes    Quit date: 08/20/2015    Years since quitting: 8.3   Smokeless tobacco: Never  Vaping Use   Vaping status: Never Used  Substance Use Topics   Alcohol use: Not Currently   Drug use: Not Currently    Types: Marijuana   Allergies Lamotrigine, Valproate sodium, Cefdinir, Doxycycline, Reglan  [metoclopramide ], Sulfa  antibiotics,  Nitrofurantoin, and Prochlorperazine   Review of Systems A thorough review of systems was obtained and all systems are negative except as noted in the HPI and PMH.   Physical Exam Vital Signs  I have reviewed the triage vital signs BP 114/64 (BP Location: Left Arm)   Pulse 68   Temp 97.9 F (36.6 C) (Oral)   Resp 18   Ht 5' 1 (1.549 m)   Wt 99.8 kg   LMP 11/21/2023 (Approximate)   SpO2 96%   BMI 41.57 kg/m  Physical Exam  ED Results and Treatments Labs (all labs ordered are listed, but only abnormal results are displayed) Labs Reviewed  CBC - Abnormal; Notable for the following components:      Result Value   Hemoglobin 11.7 (*)    HCT 34.5 (*)    All other components within normal limits  BASIC METABOLIC PANEL WITH GFR  HCG, SERUM, QUALITATIVE  D-DIMER, QUANTITATIVE  TROPONIN T, HIGH SENSITIVITY  TROPONIN T, HIGH SENSITIVITY                                                                                                                          Radiology DG Chest 2 View Result Date: 12/13/2023 CLINICAL DATA:  Chest pain. EXAM: CHEST - 2 VIEW COMPARISON:  05/06/2020 FINDINGS: The cardiomediastinal contours are normal. The lungs are clear. Pulmonary vasculature is normal. No consolidation, pleural effusion, or pneumothorax. No acute osseous abnormalities are seen. IMPRESSION: No active cardiopulmonary disease. Electronically Signed   By: Andrea Gasman M.D.   On: 12/13/2023 20:29    Pertinent labs & imaging results that were available during my care of the patient were reviewed by me and considered in my medical decision making (see MDM for details).  Medications Ordered in ED Medications  sodium chloride  0.9 % bolus 1,000 mL (1,000 mLs Intravenous New Bag/Given 12/13/23 2134)  droperidol (INAPSINE) 2.5 MG/ML injection 2.5 mg (2.5 mg Intravenous Given 12/13/23 2135)  diphenhydrAMINE  (BENADRYL ) injection 25 mg (25 mg Intravenous Given 12/13/23 2134)  Procedures Procedures  (including critical care time)  Medical Decision Making / ED Course    Medical Decision Making:    Emily Howe is a 38 y.o. female ***. The complaint involves an extensive differential diagnosis and also carries with it a high risk of complications and morbidity.  Serious etiology was considered. Ddx includes but is not limited to: ***  Complete initial physical exam performed, notably the patient was in ***.    Reviewed and confirmed nursing documentation for past medical history, family history, social history.  Vital signs reviewed.    ***       ***               Additional history obtained: -Additional history obtained from {[email or web address]:[id number]} -External records from outside source obtained and reviewed including: Chart review including previous notes, labs, imaging, consultation notes including  ***   Lab Tests: -I ordered, reviewed, and interpreted labs.   The pertinent results include:   Labs Reviewed  CBC - Abnormal; Notable for the following components:      Result Value   Hemoglobin 11.7 (*)    HCT 34.5 (*)    All other components within normal limits  BASIC METABOLIC PANEL WITH GFR  HCG, SERUM, QUALITATIVE  D-DIMER, QUANTITATIVE  TROPONIN T, HIGH SENSITIVITY  TROPONIN T, HIGH SENSITIVITY    Notable for ***  EKG   EKG Interpretation Date/Time:  Saturday December 13 2023 19:45:39 EDT Ventricular Rate:  65 PR Interval:  159 QRS Duration:  115 QT Interval:  415 QTC Calculation: 432 R Axis:   -11  Text Interpretation: Sinus rhythm Incomplete right bundle branch block Low voltage, precordial leads Similar to prior tracing Confirmed by Elnor Savant ([id number]) on 12/13/2023 8:11:11 PM         Imaging Studies ordered: I ordered imaging studies including *** I independently visualized the following  imaging with scope of interpretation limited to determining acute life threatening conditions related to emergency care; findings noted above I agree with the radiologist interpretation If any imaging was obtained with contrast I closely monitored patient for any possible adverse reaction a/w contrast administration in the emergency department   Medicines ordered and prescription drug management: Meds ordered this encounter  Medications   sodium chloride  0.9 % bolus 1,000 mL   droperidol (INAPSINE) 2.5 MG/ML injection 2.5 mg   diphenhydrAMINE  (BENADRYL ) injection 25 mg    -I have reviewed the patients home medicines and have made adjustments as needed   Consultations Obtained: I requested consultation with the ***,  and discussed lab and imaging findings as well as pertinent plan - they recommend: ***   Cardiac Monitoring: The patient was maintained on a cardiac monitor.  I personally viewed and interpreted the cardiac monitored which showed an underlying rhythm of: *** Continuous pulse oximetry interpreted by myself, ***% on ***.    Social Determinants of Health:  Diagnosis or treatment significantly limited by social determinants of health: {wssoc:28071}   Reevaluation: After the interventions noted above, I reevaluated the patient and found that they have {resolved/improved/worsened:23923::improved}  Co morbidities that complicate the patient evaluation  Past Medical History:  Diagnosis Date   Bipolar 1 disorder (HCC)    Blurry vision    Chronic migraine    Depression    Memory loss    MS (multiple sclerosis)    Numbness    Vertigo       Dispostion: Disposition decision including need for hospitalization was considered, and patient {wsdispo:28070::discharged from emergency department.}  Final Clinical Impression(s) / ED Diagnoses Final diagnoses:  None

## 2023-12-24 ENCOUNTER — Other Ambulatory Visit: Payer: Self-pay | Admitting: Medical Genetics

## 2023-12-24 DIAGNOSIS — Z006 Encounter for examination for normal comparison and control in clinical research program: Secondary | ICD-10-CM

## 2024-01-13 LAB — GENECONNECT MOLECULAR SCREEN: Genetic Analysis Overall Interpretation: NEGATIVE

## 2024-01-28 NOTE — Telephone Encounter (Signed)
 Patient scheduled for surgery on 01.06.2026 with Dr. Tobie  Robotic Assisted Hysterectomy; Bilateral Salpingectomy  Patient aware  Patient will be contacted by clinic staff to schedule pre op and post op appointments.

## 2024-02-10 ENCOUNTER — Encounter (HOSPITAL_BASED_OUTPATIENT_CLINIC_OR_DEPARTMENT_OTHER): Payer: Self-pay

## 2024-02-10 ENCOUNTER — Emergency Department (HOSPITAL_BASED_OUTPATIENT_CLINIC_OR_DEPARTMENT_OTHER)
Admission: EM | Admit: 2024-02-10 | Discharge: 2024-02-10 | Disposition: A | Discharge status: Home / Self Care | Source: Ambulatory Visit | Attending: Emergency Medicine | Admitting: Emergency Medicine

## 2024-02-10 ENCOUNTER — Other Ambulatory Visit: Payer: Self-pay

## 2024-02-10 DIAGNOSIS — M2550 Pain in unspecified joint: Secondary | ICD-10-CM

## 2024-02-10 DIAGNOSIS — M25571 Pain in right ankle and joints of right foot: Secondary | ICD-10-CM | POA: Diagnosis not present

## 2024-02-10 DIAGNOSIS — M25531 Pain in right wrist: Secondary | ICD-10-CM | POA: Insufficient documentation

## 2024-02-10 DIAGNOSIS — Z87891 Personal history of nicotine dependence: Secondary | ICD-10-CM | POA: Diagnosis not present

## 2024-02-10 MED ORDER — MELOXICAM 15 MG PO TABS: 15.0000 mg | ORAL_TABLET | Freq: Every day | ORAL | 0 refills | Status: AC

## 2024-02-10 NOTE — ED Provider Notes (Signed)
 [facility] Provider Note   CSN: [id number] Arrival date & time: 02/10/24  9051     Patient presents with: swelling   Emily Howe is a 38 y.o. female.   38 year old female presenting with right wrist and right ankle pain.  Patient notes that she has had 3 days of joint discomfort but no obvious swelling/redness/warmth/fever.  Patient was seen urgent care today and was instructed to come to the emergency department as there was a concern for an underlying blood clot that may be contributing to her joint pain.  Patient reports a history of MS and notes that her MS symptoms seem to fluctuate with the weather, she suspects that her joint discomfort may be due to MS.  She is followed by her PCP and neurologist closely.  She has no other complaints at this time.        Prior to Admission medications   Medication Sig Start Date End Date Taking? Authorizing Provider  acyclovir (ZOVIRAX) 200 MG capsule Take 200 mg by mouth 5 (five) times daily.    [provider]  dicyclomine  (BENTYL ) 20 MG tablet Take 1 tablet (20 mg total) by mouth 2 (two) times daily. 03/13/21   Geiple, Joshua, PA-C  dicyclomine  (BENTYL ) 20 MG tablet Take 1 tablet (20 mg total) by mouth 2 (two) times daily. 06/18/21   Conklin, Erica R, PA-C  ibuprofen  (ADVIL ,MOTRIN ) 800 MG tablet Take 1 tablet (800 mg total) by mouth every 8 (eight) hours as needed. 05/12/17   Long, Joshua G, MD  lithium  300 MG tablet Take 300 mg by mouth 3 (three) times daily.    [provider]  LORazepam  (ATIVAN ) 1 MG tablet Take 1 tablet (1 mg total) by mouth 3 (three) times daily as needed for up to 9 doses for anxiety. 12/03/17   Baxter Drivers, MD  omeprazole  (PRILOSEC) 20 MG capsule Take 1 capsule (20 mg total) by mouth 2 (two) times daily. 12/28/16   Lynwood Anes, MD  ondansetron  (ZOFRAN  ODT) 4 MG disintegrating tablet 4mg  ODT q4 hours prn nausea/vomit 01/13/18   Floyd, Dan, DO  Peginterferon  Beta-1a (PLEGRIDY [place]) Inject 160 mg into the skin.    [provider]  potassium chloride  SA (K-DUR,KLOR-CON ) 20 MEQ tablet Take 2 tablets (40 mEq total) by mouth daily for 3 days. 12/02/17 12/05/17  Jason Fish B, PA-C  propranolol (INDERAL) 10 MG tablet Take 10 mg by mouth every evening.     [provider]  traMADol  (ULTRAM ) 50 MG tablet Take 1 tablet (50 mg total) by mouth every 6 (six) hours as needed. 05/12/17   Long, Fonda MATSU, MD  traZODone  (DESYREL ) 50 MG tablet Take 1 tablet (50 mg total) by mouth at bedtime. 12/28/16   Lynwood Anes, MD    Allergies: Lamotrigine, Valproate sodium, Cefdinir, Doxycycline, Reglan  [metoclopramide ], Sulfa  antibiotics, Nitrofurantoin, and Prochlorperazine     Review of Systems  Updated Vital Signs  Vitals:   02/10/24 1008 02/10/24 1030  BP: (!) 138/59 119/66  Pulse: (!) 56 62  Resp: 18 18  Temp: 97.9 F (36.6 C) 97.9 F (36.6 C)  SpO2: 97% 97%  Weight: 99.8 kg      Physical Exam Vitals and nursing note reviewed.  HENT:     Head: Normocephalic.  Eyes:     Extraocular Movements: Extraocular movements intact.  Cardiovascular:     Rate and Rhythm: Normal rate and regular rhythm.     Heart sounds: Normal heart sounds.  Pulmonary:     Effort: Pulmonary effort is normal.     Breath sounds: Normal breath sounds.  Musculoskeletal:     Cervical back: Normal range of motion.     Comments: UE's: No appreciable swelling or asymmetry as compared to the opposite UE.  2+ radial pulses bilaterally.  Full active and passive ROM at the shoulder/elbow/wrist bilaterally, passive extension of the right wrist does elicit some mild discomfort. Sensation is in-tact bilaterally. LE's: No appreciable swelling or asymmetry as compared to the opposite LE.  2+ DP pulses bilaterally.  Full active and passive range of motion at the hip/knee/ankle, no appreciable calf tenderness/swelling/warmth/erythema bilateral leg, no tenderness to palpation of the medial  or lateral malleolus bilaterally, passive dorsiflexion of the right ankle does elicit some mild discomfort. Sensation is in-tact bilaterally. Photos available to demonstrate limb symmetry  Skin:    General: Skin is warm and dry.  Neurological:     Mental Status: She is alert and oriented to person, place, and time.        (all labs ordered are listed, but only abnormal results are displayed) Labs Reviewed - No data to display  EKG: None  Radiology: No results found.   Procedures   Medications Ordered in the ED - No data to display                                  Medical Decision Making This patient presents to the ED for concern of joint pain, this involves an extensive number of treatment options, and is a complaint that carries with it a high risk of complications and morbidity.  The differential diagnosis includes arthralgias due to MS, arthralgias due to other rheumatologic condition, septic joint, DVT   Co morbidities that complicate the patient evaluation  MS    Additional history obtained:  Additional history obtained from record review External records from outside source obtained and reviewed including urgent care note from this morning    Cardiac Monitoring: / EKG:  The patient was maintained on a cardiac monitor.  I personally viewed and interpreted the cardiac monitored which showed an underlying rhythm of: NSR   Problem List / ED Course / Critical interventions / Medication management I have reviewed the patients home medicines and have made adjustments as needed   Social Determinants of Health:  Former tobacco use, financial instability   Test / Admission - Considered:  Physical exam is largely unremarkable as above, patient does have some discomfort with passive extension of the right wrist and passive dorsiflexion of the right ankle, however as noted above there is no obvious limb asymmetry that would raise suspicion for conditions like  DVT, patient has normal strength and sensation and does not have any appreciable erythema/warmth of the joints or elsewhere on the limbs, I have no suspicion for conditions like septic arthritis at this time.   Patient mentions that she has a history of MS and will occasionally have joint pain that is exacerbated by changes in the weather, she does not feel that she has any joint swelling or swelling of her limbs that is different from her baseline, she did not want to come to the emergency department but was told by urgent care that she should for further evaluation.  At this point given largely reassuring physical exam findings I do not feel that any labs/imaging are warranted, I believe that this is likely a flare  of her known MS versus other rheumatologic condition that may be contributing to joint discomfort.  I recommend the patient discontinue ibuprofen  and will start meloxicam, I recommend that she discuss her symptoms with her PCP in the event that she does not have symptomatic improvement with this.   I discussed the signs/symptoms of DVT in depth with the patient, although she is not demonstrating any of the symptoms today she understands that if these present themselves she needs to return to the emergency department.  She is in agreement with this plan and is appropriate for discharge at this time.    Risk Prescription drug management.        Final diagnoses:  Arthralgia, unspecified joint    ED Discharge Orders          Ordered    meloxicam (MOBIC) 15 MG tablet  Daily        02/10/24 [address], [place] 02/10/24 1201    Elnor Savant A, DO 02/11/24 [phone]

## 2024-02-10 NOTE — ED Notes (Signed)
 Report received from [place], [place]. Assuming pt care at this time.

## 2024-02-10 NOTE — Discharge Instructions (Addendum)
 The joint pain you are experiencing in your right wrist and right ankle may be secondary to a flareup of your MS.  Stop ibuprofen  and start meloxicam, 1 tablet by mouth once daily.  You may continue Tylenol  as needed.  Follow-up with your PCP if your symptoms persist.  Return to the emergency department if your symptoms worsen.

## 2024-02-10 NOTE — ED Triage Notes (Signed)
 Pt reports right sided swelling x 3 days. Swelling right hand/wrist and right ankle causing foot to hurt. Went to UC and sent here for further evaluation
